# Patient Record
Sex: Female | Born: 1960
Health system: Southern US, Community
[De-identification: ages and names within clinical notes are randomized; demographics above are authoritative.]

## PROBLEM LIST (undated history)

## (undated) DIAGNOSIS — R21 Rash and other nonspecific skin eruption: Secondary | ICD-10-CM

## (undated) DIAGNOSIS — N62 Hypertrophy of breast: Secondary | ICD-10-CM

## (undated) DIAGNOSIS — M1990 Unspecified osteoarthritis, unspecified site: Secondary | ICD-10-CM

## (undated) DIAGNOSIS — E049 Nontoxic goiter, unspecified: Secondary | ICD-10-CM

## (undated) HISTORY — DX: Unspecified osteoarthritis, unspecified site: M19.90

## (undated) HISTORY — PX: BREAST SURGERY: SHX581

## (undated) HISTORY — PX: LABIAL ADHESION LYSIS: SHX324

## (undated) HISTORY — DX: Rash and other nonspecific skin eruption: R21

## (undated) HISTORY — DX: Nontoxic goiter, unspecified: E04.9

## (undated) HISTORY — PX: ABDOMINAL HYSTERECTOMY: SHX81

## (undated) HISTORY — PX: TUBAL LIGATION: SHX77

## (undated) SURGERY — BREAST REDUCTION WITH LIPOSUCTION
Anesthesia: General | Laterality: Bilateral

---

## 1999-09-22 ENCOUNTER — Encounter (INDEPENDENT_AMBULATORY_CARE_PROVIDER_SITE_OTHER): Payer: Self-pay

## 1999-09-22 ENCOUNTER — Other Ambulatory Visit: Admission: RE | Admit: 1999-09-22 | Discharge: 1999-09-22 | Payer: Self-pay | Admitting: Obstetrics and Gynecology

## 1999-11-25 ENCOUNTER — Emergency Department (HOSPITAL_COMMUNITY): Admission: EM | Admit: 1999-11-25 | Discharge: 1999-11-25 | Payer: Self-pay | Admitting: *Deleted

## 1999-11-26 ENCOUNTER — Encounter: Payer: Self-pay | Admitting: Emergency Medicine

## 2000-02-16 ENCOUNTER — Encounter (INDEPENDENT_AMBULATORY_CARE_PROVIDER_SITE_OTHER): Payer: Self-pay | Admitting: Specialist

## 2000-02-16 ENCOUNTER — Ambulatory Visit (HOSPITAL_COMMUNITY): Admission: RE | Admit: 2000-02-16 | Discharge: 2000-02-16 | Payer: Self-pay | Admitting: Obstetrics and Gynecology

## 2002-07-29 ENCOUNTER — Encounter: Payer: Self-pay | Admitting: Internal Medicine

## 2002-07-29 ENCOUNTER — Encounter: Admission: RE | Admit: 2002-07-29 | Discharge: 2002-07-29 | Payer: Self-pay | Admitting: Internal Medicine

## 2002-08-07 ENCOUNTER — Encounter: Payer: Self-pay | Admitting: Internal Medicine

## 2002-08-07 ENCOUNTER — Encounter: Admission: RE | Admit: 2002-08-07 | Discharge: 2002-08-07 | Payer: Self-pay | Admitting: Internal Medicine

## 2002-12-07 ENCOUNTER — Encounter: Payer: Self-pay | Admitting: Internal Medicine

## 2002-12-07 ENCOUNTER — Encounter: Admission: RE | Admit: 2002-12-07 | Discharge: 2002-12-07 | Payer: Self-pay | Admitting: Internal Medicine

## 2004-01-28 ENCOUNTER — Encounter: Admission: RE | Admit: 2004-01-28 | Discharge: 2004-01-28 | Payer: Self-pay | Admitting: Obstetrics and Gynecology

## 2004-04-07 ENCOUNTER — Encounter (INDEPENDENT_AMBULATORY_CARE_PROVIDER_SITE_OTHER): Payer: Self-pay | Admitting: Specialist

## 2004-04-07 ENCOUNTER — Ambulatory Visit (HOSPITAL_COMMUNITY): Admission: RE | Admit: 2004-04-07 | Discharge: 2004-04-07 | Payer: Self-pay | Admitting: Obstetrics and Gynecology

## 2005-02-22 ENCOUNTER — Encounter: Admission: RE | Admit: 2005-02-22 | Discharge: 2005-02-22 | Payer: Self-pay | Admitting: Obstetrics and Gynecology

## 2005-06-08 ENCOUNTER — Other Ambulatory Visit: Admission: RE | Admit: 2005-06-08 | Discharge: 2005-06-08 | Payer: Self-pay | Admitting: Obstetrics and Gynecology

## 2006-03-26 ENCOUNTER — Encounter: Admission: RE | Admit: 2006-03-26 | Discharge: 2006-03-26 | Payer: Self-pay | Admitting: Obstetrics and Gynecology

## 2006-09-13 ENCOUNTER — Other Ambulatory Visit: Admission: RE | Admit: 2006-09-13 | Discharge: 2006-09-13 | Payer: Self-pay | Admitting: Obstetrics and Gynecology

## 2006-11-22 ENCOUNTER — Encounter (INDEPENDENT_AMBULATORY_CARE_PROVIDER_SITE_OTHER): Payer: Self-pay | Admitting: Specialist

## 2006-11-22 ENCOUNTER — Ambulatory Visit (HOSPITAL_BASED_OUTPATIENT_CLINIC_OR_DEPARTMENT_OTHER): Admission: RE | Admit: 2006-11-22 | Discharge: 2006-11-22 | Payer: Self-pay | Admitting: Obstetrics and Gynecology

## 2007-04-03 ENCOUNTER — Encounter: Admission: RE | Admit: 2007-04-03 | Discharge: 2007-04-03 | Payer: Self-pay | Admitting: Obstetrics and Gynecology

## 2007-11-28 ENCOUNTER — Emergency Department (HOSPITAL_COMMUNITY): Admission: EM | Admit: 2007-11-28 | Discharge: 2007-11-28 | Payer: Self-pay | Admitting: Family Medicine

## 2008-01-24 ENCOUNTER — Emergency Department (HOSPITAL_COMMUNITY): Admission: EM | Admit: 2008-01-24 | Discharge: 2008-01-25 | Payer: Self-pay | Admitting: Emergency Medicine

## 2008-01-24 ENCOUNTER — Emergency Department (HOSPITAL_COMMUNITY): Admission: EM | Admit: 2008-01-24 | Discharge: 2008-01-24 | Payer: Self-pay | Admitting: Family Medicine

## 2008-03-04 ENCOUNTER — Emergency Department (HOSPITAL_COMMUNITY): Admission: EM | Admit: 2008-03-04 | Discharge: 2008-03-04 | Payer: Self-pay | Admitting: Emergency Medicine

## 2008-07-06 ENCOUNTER — Encounter: Admission: RE | Admit: 2008-07-06 | Discharge: 2008-07-06 | Payer: Self-pay | Admitting: Obstetrics and Gynecology

## 2009-09-06 ENCOUNTER — Encounter: Admission: RE | Admit: 2009-09-06 | Discharge: 2009-09-06 | Payer: Self-pay | Admitting: Obstetrics and Gynecology

## 2010-02-14 ENCOUNTER — Encounter: Admission: RE | Admit: 2010-02-14 | Discharge: 2010-02-14 | Payer: Self-pay | Admitting: Family Medicine

## 2010-09-11 ENCOUNTER — Encounter: Admission: RE | Admit: 2010-09-11 | Discharge: 2010-09-11 | Payer: Self-pay | Admitting: Obstetrics and Gynecology

## 2010-12-23 ENCOUNTER — Encounter: Payer: Self-pay | Admitting: Internal Medicine

## 2010-12-25 ENCOUNTER — Encounter: Payer: Self-pay | Admitting: Obstetrics and Gynecology

## 2011-02-08 ENCOUNTER — Other Ambulatory Visit: Payer: Self-pay | Admitting: Family Medicine

## 2011-02-08 DIAGNOSIS — E041 Nontoxic single thyroid nodule: Secondary | ICD-10-CM

## 2011-02-12 ENCOUNTER — Ambulatory Visit
Admission: RE | Admit: 2011-02-12 | Discharge: 2011-02-12 | Disposition: A | Discharge status: Home / Self Care | Payer: BC Managed Care – PPO | Source: Ambulatory Visit | Attending: Family Medicine | Admitting: Family Medicine

## 2011-02-12 DIAGNOSIS — E041 Nontoxic single thyroid nodule: Secondary | ICD-10-CM

## 2011-02-14 ENCOUNTER — Other Ambulatory Visit: Payer: Self-pay | Admitting: Family Medicine

## 2011-02-14 DIAGNOSIS — E041 Nontoxic single thyroid nodule: Secondary | ICD-10-CM

## 2011-02-27 ENCOUNTER — Other Ambulatory Visit (HOSPITAL_COMMUNITY)
Admission: RE | Admit: 2011-02-27 | Discharge: 2011-02-27 | Disposition: A | Discharge status: Home / Self Care | Payer: BC Managed Care – PPO | Source: Ambulatory Visit | Attending: Interventional Radiology | Admitting: Interventional Radiology

## 2011-02-27 ENCOUNTER — Ambulatory Visit
Admission: RE | Admit: 2011-02-27 | Discharge: 2011-02-27 | Disposition: A | Discharge status: Home / Self Care | Payer: BC Managed Care – PPO | Source: Ambulatory Visit | Attending: Family Medicine | Admitting: Family Medicine

## 2011-02-27 ENCOUNTER — Other Ambulatory Visit: Payer: Self-pay | Admitting: Interventional Radiology

## 2011-02-27 DIAGNOSIS — E041 Nontoxic single thyroid nodule: Secondary | ICD-10-CM | POA: Insufficient documentation

## 2011-04-20 NOTE — H&P (Signed)
NAME:  Lisa Bell, Lisa Bell                       ACCOUNT NO.:  [id number]   MEDICAL RECORD NO.:  [id number]                   PATIENT TYPE:  AMB   LOCATION:  SDC                                  FACILITY:  [facility]   PHYSICIAN:  Huel Cote, M.D.              DATE OF BIRTH:  1961/06/22   DATE OF ADMISSION:  DATE OF DISCHARGE:                                HISTORY & PHYSICAL   The patient is a 50 year old G2, P0, who is admitted for a scheduled  laparoscopic bilateral tubal ligation.  The patient desires permanent  sterility and has given other options of birth control, however feels that  she does not wish to try any other options but wishes complete and permanent  sterilization.   PAST MEDICAL HISTORY:  Negative.   PAST OBSTETRICAL HISTORY:  Significant for elective abortion x2.   PAST SURGICAL HISTORY:  History of LEEP in 2001.   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  Pap smears  after that have been essentially negative.   The patient has allergies to SULFA, TETRACYCLINE, and CODEINE.   Current medications include Ortho Evra patch.   PHYSICAL EXAMINATION:  VITAL SIGNS:  The patient's weight is 168 pounds,  blood pressure 108/60.  CARDIAC:  Cardiac exam is regular rate and rhythm.  CHEST:  Lungs are clear.  ABDOMEN:  Soft and nontender.  PELVIC:  She had normal external genitalia.  The uterus is normal size.  Adnexa have no masses noted.   As stated previously, the patient was counseled closely regarding all of her  contraception options, including Mirena, birth control pills, permanent  sterilization with Essure or tubal ligation.  The patient has considered all  her options and definitely desires to proceed with permanent sterilization.  She understands there is a risk of failure of approximately one in 100 after  tubal ligation.  She also understands the risks of damage to bowel or  bladder, possible bleeding and infection that may occur with  laparoscopic  surgery, and desires to proceed as stated.                                               Huel Cote, M.D.    KR/MEDQ  D:  04/06/2004  T:  04/06/2004  Job:  [id number]

## 2011-04-20 NOTE — Op Note (Signed)
NAME:  Lisa Bell, Lisa Bell                       ACCOUNT NO.:  [id number]   MEDICAL RECORD NO.:  [id number]                   PATIENT TYPE:  AMB   LOCATION:  SDC                                  FACILITY:  [facility]   PHYSICIAN:  Huel Cote, M.D.              DATE OF BIRTH:  15-Nov-1961   DATE OF PROCEDURE:  04/07/2004  DATE OF DISCHARGE:                                 OPERATIVE REPORT   PREOPERATIVE DIAGNOSIS:  Desires sterility.   POSTOPERATIVE DIAGNOSIS:  Desires sterility with two left paratubal cysts.   PROCEDURE:  Laparoscopic tubal fulguration and excision of left paratubal  cyst x 2.   SURGEON:  Huel Cote, M.D.   ANESTHESIA:  General.   FINDINGS:  Ovaries were normal bilaterally.  Uterus was normal.  Tubes had  mild distal clubbing noted bilaterally, and the left tube had two distal  paratubal cysts which were excised due to torsion risk.   FLUIDS:  Estimated blood loss minimal.  IV fluids 1200 cc LR.  Urine output  100 cc clear.   PROCEDURE:  The patient was taken to the operating room where general  anesthesia was obtained without difficulty.  She was then prepped and draped  in the normal sterile fashion in the dorsal lithotomy position.  A speculum  was placed within the patient's vagina and the cervic identified and grasped  with a single-tooth tenaculum.  The cervical os was noted to be stenotic  from her previous LEEP procedure and required some dilation before the Hulka  tenaculum could be introduced.  With the Hulka tenaculum on the cervix, the  speculum was removed and attention was turned to the patient's abdomen,  where a small infraumbilical incision was made with the scalpel.  The Veress  needle was then introduced into this incision and intraperitoneal placement  confirmed with both aspiration and injection with normal saline.  Gas flow  was then applied and pneumoperitoneum obtained with approximately 2 L of CO2  gas.  The Veress needle was then  removed and the trocar 10-11 in size placed  within the umbilicus.  The camera was then introduced and the pelvis  inspected with the findings as previously stated.  Each fallopian tube was  then grasped with the Kleppinger cautery unit in approximately two to three  sequences to have good blanching of approximately 2 cm segments of tube.  This was performed bilaterally, with good result.   Attention was then turned to the left fallopian tube where two distal  paratubal cysts were noted at the fimbriated end of the tube.  They did  appear to be somewhat twisted and were felt to have a significant torsion  risk; therefore, Kleppinger cautery was used to cauterize them at their base  and the scissors then removed them from the tube itself.  They were placed  in the anterior cul-de-sac, drained with a needle aspiration unit, and then  the cyst itself  wall removed through the 10-11 trocar site with the  graspers.  These were sent to pathology.   All areas were then inspected and found to be hemostatic.  Therefore the  camera was removed and the 10-11 trocar removed after pneumoperitoneum was  reduced.  The umbilical incision was then closed with one interrupted suture  of 0 Vicryl in a deep stitch and a subcuticular stitch of 3-0 Vicryl.  Sponge, lap, and needle counts were correct x 2.  The tenaculum was removed  from the patient's vagina and she was awakened and taken to the recovery  room in stable condition.                                               Huel Cote, M.D.    KR/MEDQ  D:  04/07/2004  T:  04/07/2004  Job:  [id number]

## 2011-04-20 NOTE — Op Note (Signed)
NAMEGAETANA, Lisa Bell             ACCOUNT NO.:  [id number]   MEDICAL RECORD NO.:  [id number]          PATIENT TYPE:  AMB   LOCATION:  [facility]                         FACILITY:  [facility]   PHYSICIAN:  Malachi Pro. Ambrose Mantle, M.D. DATE OF BIRTH:  10-24-1961   DATE OF PROCEDURE:  11/22/2006  DATE OF DISCHARGE:                               OPERATIVE REPORT   PREOPERATIVE DIAGNOSIS:  Vulvar cyst.   POSTOPERATIVE DIAGNOSES:  Vulvar cyst.   OPERATION:  Removal of vulvar cyst.   OPERATOR:  Dr. Tracey Harries.   ANESTHESIA:  General anesthesia.   The patient was brought to the operating room and placed under  satisfactory general anesthesia and placed in lithotomy position.  The  exam revealed what was about a 3 x 1-1/2 cm subcutaneous cyst in the  right vulva at approximately 7:30 to 8:00 from the vaginal introitus.  The cyst could be grasped and held up in the subcu tissue and it felt  like it was completely separate from any chance of communication with  the rectum.  The area was prepped with Betadine solution, draped as a  sterile field and with a 15 knife blade the cyst was removed.  I tried  to stay as close to the cyst as I could so that as little skin as  possible would be removed.  I then reapproximated the skin with  interrupted vertical mattress sutures of 3-0 Vicryl and simple  interrupted sutures of 3-0 Vicryl. At the end of the procedure, there  was no bleeding.  Blood loss for the procedure was less than 50 mL.  Sponge and needle counts were correct and she was returned to recovery  in satisfactory condition.      Malachi Pro. Ambrose Mantle, M.D.  Electronically Signed     TFH/MEDQ  D:  11/22/2006  T:  11/23/2006  Job:  [id number]

## 2011-07-15 ENCOUNTER — Inpatient Hospital Stay (INDEPENDENT_AMBULATORY_CARE_PROVIDER_SITE_OTHER)
Admission: RE | Admit: 2011-07-15 | Discharge: 2011-07-15 | Disposition: A | Discharge status: Home / Self Care | Payer: Self-pay | Source: Ambulatory Visit | Attending: Family Medicine | Admitting: Family Medicine

## 2011-07-15 DIAGNOSIS — M549 Dorsalgia, unspecified: Secondary | ICD-10-CM

## 2011-07-15 DIAGNOSIS — M799 Soft tissue disorder, unspecified: Secondary | ICD-10-CM

## 2011-08-08 HISTORY — PX: FINGER AMPUTATION: SHX636

## 2011-08-09 ENCOUNTER — Emergency Department (HOSPITAL_COMMUNITY): Payer: Worker's Compensation

## 2011-08-09 ENCOUNTER — Observation Stay (HOSPITAL_COMMUNITY)
Admission: EM | Admit: 2011-08-09 | Discharge: 2011-08-09 | Disposition: A | Discharge status: Home / Self Care | Payer: Worker's Compensation | Source: Ambulatory Visit | Attending: Orthopedic Surgery | Admitting: Orthopedic Surgery

## 2011-08-09 DIAGNOSIS — Y990 Civilian activity done for income or pay: Secondary | ICD-10-CM | POA: Insufficient documentation

## 2011-08-09 DIAGNOSIS — W319XXA Contact with unspecified machinery, initial encounter: Secondary | ICD-10-CM | POA: Insufficient documentation

## 2011-08-09 DIAGNOSIS — S6710XA Crushing injury of unspecified finger(s), initial encounter: Principal | ICD-10-CM | POA: Insufficient documentation

## 2011-08-09 DIAGNOSIS — S62639B Displaced fracture of distal phalanx of unspecified finger, initial encounter for open fracture: Secondary | ICD-10-CM | POA: Insufficient documentation

## 2011-08-09 DIAGNOSIS — IMO0002 Reserved for concepts with insufficient information to code with codable children: Secondary | ICD-10-CM | POA: Insufficient documentation

## 2011-08-09 DIAGNOSIS — Y9269 Other specified industrial and construction area as the place of occurrence of the external cause: Secondary | ICD-10-CM | POA: Insufficient documentation

## 2011-08-15 NOTE — Op Note (Signed)
Lisa Bell, Lisa Bell NO.:  [id number]  MEDICAL RECORD NO.:  [id number]  LOCATION:  2550                         FACILITY:  [facility]  PHYSICIAN:  Lisa Done, MD  DATE OF BIRTH:  02/06/1961  DATE OF PROCEDURE: DATE OF DISCHARGE:                              OPERATIVE REPORT   PREOPERATIVE DIAGNOSES: 1. Right index finger traumatic amputation through the level of the     distal phalanx. 2. Right index finger middle phalanx fracture involving the articular     surface comminuted fracture.  POSTOPERATIVE DIAGNOSES: 1. Right index finger traumatic amputation through the level of the     distal phalanx. 2. Right index finger middle phalanx fracture involving the articular     surface comminuted fracture.  ATTENDING PHYSICIAN:  Lisa Done, MD who scrubbed and was present for the entire procedure.  ASSISTANT SURGEON:  None.  ANESTHESIA:  General via LMA.  TOURNIQUET TIME:  Less than 50 minutes at 250 mmHg.  SURGICAL PROCEDURES: 1. Left index finger amputation with local neurectomies and a primary     wound closure. 2. Left index finger open treatment of the middle phalanx fracture     involving the articular surface without internal fixation.  INTRAOPERATIVE FINDINGS:  The patient did have significant crush injury to the index finger.  The soft tissues were not amenable to replantation of the distal tip.  The patient had no distal interphalangeal joint.  So if this finger would have been replanted, there would have been no joint to reconstruct.  The amputation was through the distal phalanx, but the articular surface of the middle phalanx was disrupted and the patient really had no articular cartilage of the distal phalanx.  SURGICAL INDICATIONS:  Lisa Bell is a right-hand-dominant female who sustained a crushing injury to her right index finger with the open distal phalanx injury.  The patient was seen and evaluated in the emergency  department and was recommended to undergo the above procedure. Risks, benefits, and alternatives were discussed in detail with the patient and a signed informed consent was obtained.  Risks include but not limited to bleeding; infection; damage to nearby nerves, arteries, or tendons; persistent pain into the thumb; loss of movement and need for further surgical intervention.  DESCRIPTION OF PROCEDURE:  The patient was properly identified in the preop holding area and a mark with permanent marker made on the right index finger indicating the correct operative site.  The patient was then brought back to the operating room,  placed supine on the anesthesia table where general anesthesia was administered.  The patient tolerated this well.  A well-padded tourniquet was then placed on the right brachium and sealed with 1000 drape.  The right upper extremity was prepped and draped in normal sterile fashion.  Time-out was called. Correct site was identified and procedure was then begun.  Attention was then turned to evaluation of the soft tissue to see if the cannula would a candidate for replantation.  The soft tissue were not amenable to replantation.  The patient had no distal interphalangeal joint with the extensive comminution as noted above.  Following this, completion, amputation was then carried out  through the level of the distal interphalangeal joint.  The FDP was then identified and then transected and allowed to retract proximally.  The digital nerves were identified and digital nerve neurectomies were then carried out both radially and ulnarly.  The patient did have a comminuted middle phalanx fracture involving the articular surface.  It was left and open treatment was then carried out.  I did not destabilize the comminuted fracture and no internal fixation was used.  The wound was then thoroughly irrigated. Copious wound irrigation was then Bell.  Final debridement of the skin and  subcutaneous tissues were then carried out.  Following this, the skin flaps were then closed primarily.  This was closed with simple 5-0 nylon sutures.  Following this, tourniquet was deflated.  There was good perfusion of the finger.  An 8 mL of 0.25% Marcaine infiltrated locally. Adaptic dressing, sterile compressive bandage was then applied.  The patient tolerated the procedure well and returned to the recovery room in good condition.  POSTOPERATIVE PLAN:  The patient will be discharged home, seen back in the office in approximately 7 days for wound check.  I sent down to our therapist for a small tip protector splint and begin working on her PIP movement.     Lisa Done, MD     FWO/MEDQ  D:  08/09/2011  T:  08/09/2011  Job:  [id number]  Electronically Signed by Lisa Bell IV MD on 08/15/2011 11:37:52 AM

## 2011-09-06 ENCOUNTER — Other Ambulatory Visit: Payer: Self-pay | Admitting: Obstetrics and Gynecology

## 2011-09-06 DIAGNOSIS — Z1231 Encounter for screening mammogram for malignant neoplasm of breast: Secondary | ICD-10-CM

## 2011-09-25 ENCOUNTER — Ambulatory Visit
Admission: RE | Admit: 2011-09-25 | Discharge: 2011-09-25 | Disposition: A | Discharge status: Home / Self Care | Payer: Worker's Compensation | Source: Ambulatory Visit | Attending: Obstetrics and Gynecology | Admitting: Obstetrics and Gynecology

## 2011-09-25 DIAGNOSIS — Z1231 Encounter for screening mammogram for malignant neoplasm of breast: Secondary | ICD-10-CM

## 2012-02-12 ENCOUNTER — Other Ambulatory Visit: Payer: Self-pay | Admitting: Family Medicine

## 2012-02-12 DIAGNOSIS — E041 Nontoxic single thyroid nodule: Secondary | ICD-10-CM

## 2012-02-13 ENCOUNTER — Other Ambulatory Visit: Payer: Self-pay

## 2012-02-14 ENCOUNTER — Other Ambulatory Visit: Payer: Self-pay

## 2012-02-15 ENCOUNTER — Other Ambulatory Visit: Payer: Self-pay

## 2012-02-25 ENCOUNTER — Ambulatory Visit
Admission: RE | Admit: 2012-02-25 | Discharge: 2012-02-25 | Disposition: A | Discharge status: Home / Self Care | Payer: BC Managed Care – PPO | Source: Ambulatory Visit | Attending: Family Medicine | Admitting: Family Medicine

## 2012-02-25 DIAGNOSIS — E041 Nontoxic single thyroid nodule: Secondary | ICD-10-CM

## 2012-03-03 ENCOUNTER — Encounter (INDEPENDENT_AMBULATORY_CARE_PROVIDER_SITE_OTHER): Payer: Self-pay | Admitting: Surgery

## 2012-03-17 ENCOUNTER — Ambulatory Visit (INDEPENDENT_AMBULATORY_CARE_PROVIDER_SITE_OTHER): Payer: BC Managed Care – PPO | Admitting: Surgery

## 2012-04-04 ENCOUNTER — Ambulatory Visit (INDEPENDENT_AMBULATORY_CARE_PROVIDER_SITE_OTHER): Payer: BC Managed Care – PPO | Admitting: Surgery

## 2012-04-30 ENCOUNTER — Ambulatory Visit (INDEPENDENT_AMBULATORY_CARE_PROVIDER_SITE_OTHER): Payer: BC Managed Care – PPO | Admitting: Surgery

## 2012-05-08 ENCOUNTER — Encounter (INDEPENDENT_AMBULATORY_CARE_PROVIDER_SITE_OTHER): Payer: Self-pay | Admitting: Surgery

## 2012-06-04 ENCOUNTER — Encounter (INDEPENDENT_AMBULATORY_CARE_PROVIDER_SITE_OTHER): Payer: Self-pay | Admitting: Surgery

## 2012-06-04 ENCOUNTER — Ambulatory Visit (INDEPENDENT_AMBULATORY_CARE_PROVIDER_SITE_OTHER): Payer: BC Managed Care – PPO | Admitting: Surgery

## 2012-06-04 VITALS — BP 112/84 | HR 75 | Temp 98.3°F | Ht 63.0 in | Wt 169.4 lb

## 2012-06-04 DIAGNOSIS — E042 Nontoxic multinodular goiter: Secondary | ICD-10-CM | POA: Insufficient documentation

## 2012-06-04 NOTE — Progress Notes (Signed)
Patient ID: Lisa Bell, female   DOB: 01-05-1961, 51 y.o.   MRN: [id number]  Chief Complaint  Patient presents with  . Pre-op Exam    eval multi nodular goiter    HPI Lisa Bell is a 51 y.o. female.  She is referred by Corky Mull PA for evaluation of a multinodular goiter. I am uncertain whether she actually has any symptoms. On questioning she does not have hyper or hypothyroidism. She has no difficulty swallowing. She reports she believes she does have swelling on the right side of her neck which is visible but is otherwise asymptomatic. She had a recent thyroid ultrasound demonstrating multinodular goiter and a biopsy consistent with goiter as well. Her mother had a partial thyroidectomy for a symptomatic goiter HPI  Past Medical History  Diagnosis Date  . Rash, skin   . Goiter   . Arthritis     Past Surgical History  Procedure Date  . Tubal ligation   . Breast surgery     cyst (benign)  . Labial adhesion lysis   . Finger amputation 08/08/11    right    Family History  Problem Relation Age of Onset  . Cancer Mother     ovarian  . Kidney disease Father     Social History History  Substance Use Topics  . Smoking status: Never Smoker   . Smokeless tobacco: Not on file  . Alcohol Use: No    Allergies  Allergen Reactions  . Codeine Other (See Comments)    GI UPSET  . Septra (Bactrim) Other (See Comments)    GI UPSET  . Tetracyclines & Related Other (See Comments)    GI UPSET    Current Outpatient Prescriptions  Medication Sig Dispense Refill  . Ascorbic Acid (VITAMIN C) 100 MG tablet Take 100 mg by mouth daily.      . calcium & magnesium carbonates (MYLANTA) 311-232 MG per tablet Take 1 tablet by mouth 2 (two) times daily.      . cholecalciferol (VITAMIN D) 1000 UNITS tablet Take 3,000 Units by mouth daily.      Marland Kitchen desonide (DESOWEN) 0.05 % cream Apply topically 2 (two) times daily.        Review of Systems Review of Systems  Constitutional:  Negative for fever, chills and unexpected weight change.  HENT: Negative for hearing loss, congestion, sore throat, trouble swallowing and voice change.   Eyes: Negative for visual disturbance.  Respiratory: Negative for cough and wheezing.   Cardiovascular: Negative for chest pain, palpitations and leg swelling.  Gastrointestinal: Negative for nausea, vomiting, abdominal pain, diarrhea, constipation, blood in stool, abdominal distention and anal bleeding.  Genitourinary: Negative for hematuria, vaginal bleeding and difficulty urinating.  Musculoskeletal: Negative for arthralgias.  Skin: Negative for rash and wound.  Neurological: Negative for seizures, syncope and headaches.  Hematological: Negative for adenopathy. Does not bruise/bleed easily.  Psychiatric/Behavioral: Negative for confusion.    Blood pressure 112/84, pulse 75, temperature 98.3 F (36.8 C), temperature source Temporal, height 5\' 3"  (1.6 m), weight 169 lb 6.4 oz (76.839 kg), SpO2 94.00%.  Physical Exam Physical Exam  Constitutional: She appears well-developed and well-nourished. No distress.  HENT:  Head: Normocephalic and atraumatic.  Right Ear: External ear normal.  Left Ear: External ear normal.  Nose: Nose normal.  Mouth/Throat: Oropharynx is clear and moist.  Eyes: Conjunctivae are normal. Pupils are equal, round, and reactive to light. Right eye exhibits no discharge. Left eye exhibits no discharge. No scleral icterus.  Neck:  Normal range of motion. Neck supple. No JVD present. No tracheal deviation present. Thyromegaly present.  Cardiovascular: Normal rate, regular rhythm, normal heart sounds and intact distal pulses.   Pulmonary/Chest: Effort normal and breath sounds normal. No respiratory distress. She has no wheezes. She has no rales.  Lymphadenopathy:    She has no cervical adenopathy.  Skin: Skin is warm and dry. No rash noted. She is not diaphoretic. No erythema.  Psychiatric: Her behavior is normal.     Data Reviewed I have reviewed her ultrasound showing multinodular goiter with a slightly large dominant right thyroid nodule. Biopsy is consistent with a goiter.  Assessment    Multinodular goiter    Plan    We discussed thyroidectomy. I gave her literature regarding this. I discussed all the risks of undergoing a total thyroidectomy. Since she is currently asymptomatic, and there is no evidence of malignancy, she elected to hold on thyroidectomy which I feel is very reasonable. She would like to be referred to an endocrinologist for another opinion as well. I will see her back in 6 months to discuss this further. I will see her back sooner should she change her mind       Simranjit Thayer A 06/04/2012, 4:23 PM

## 2012-08-21 ENCOUNTER — Other Ambulatory Visit: Payer: Self-pay | Admitting: Obstetrics and Gynecology

## 2012-08-21 DIAGNOSIS — Z1231 Encounter for screening mammogram for malignant neoplasm of breast: Secondary | ICD-10-CM

## 2012-09-25 ENCOUNTER — Ambulatory Visit
Admission: RE | Admit: 2012-09-25 | Discharge: 2012-09-25 | Disposition: A | Discharge status: Home / Self Care | Payer: BC Managed Care – PPO | Source: Ambulatory Visit | Attending: Obstetrics and Gynecology | Admitting: Obstetrics and Gynecology

## 2012-09-25 ENCOUNTER — Ambulatory Visit: Payer: Self-pay

## 2012-09-25 DIAGNOSIS — Z1231 Encounter for screening mammogram for malignant neoplasm of breast: Secondary | ICD-10-CM

## 2012-09-26 ENCOUNTER — Other Ambulatory Visit: Payer: Self-pay | Admitting: Physician Assistant

## 2012-09-26 ENCOUNTER — Ambulatory Visit
Admission: RE | Admit: 2012-09-26 | Discharge: 2012-09-26 | Disposition: A | Discharge status: Home / Self Care | Payer: BC Managed Care – PPO | Source: Ambulatory Visit | Attending: Physician Assistant | Admitting: Physician Assistant

## 2012-09-26 DIAGNOSIS — T1490XA Injury, unspecified, initial encounter: Secondary | ICD-10-CM

## 2013-08-21 ENCOUNTER — Other Ambulatory Visit: Payer: Self-pay

## 2013-08-21 DIAGNOSIS — Z1231 Encounter for screening mammogram for malignant neoplasm of breast: Secondary | ICD-10-CM

## 2013-09-28 ENCOUNTER — Ambulatory Visit
Admission: RE | Admit: 2013-09-28 | Discharge: 2013-09-28 | Disposition: A | Discharge status: Home / Self Care | Payer: BC Managed Care – PPO | Source: Ambulatory Visit

## 2013-09-28 DIAGNOSIS — Z1231 Encounter for screening mammogram for malignant neoplasm of breast: Secondary | ICD-10-CM

## 2014-08-04 ENCOUNTER — Other Ambulatory Visit: Payer: Self-pay | Admitting: Physician Assistant

## 2014-08-04 DIAGNOSIS — E049 Nontoxic goiter, unspecified: Secondary | ICD-10-CM

## 2014-08-04 DIAGNOSIS — M542 Cervicalgia: Secondary | ICD-10-CM

## 2014-08-05 ENCOUNTER — Other Ambulatory Visit: Payer: BC Managed Care – PPO

## 2014-08-10 ENCOUNTER — Other Ambulatory Visit: Payer: BC Managed Care – PPO

## 2014-08-12 ENCOUNTER — Ambulatory Visit
Admission: RE | Admit: 2014-08-12 | Discharge: 2014-08-12 | Disposition: A | Discharge status: Home / Self Care | Payer: BC Managed Care – PPO | Source: Ambulatory Visit | Attending: Physician Assistant | Admitting: Physician Assistant

## 2014-08-12 DIAGNOSIS — E049 Nontoxic goiter, unspecified: Secondary | ICD-10-CM

## 2014-08-12 DIAGNOSIS — M542 Cervicalgia: Secondary | ICD-10-CM

## 2014-08-17 ENCOUNTER — Other Ambulatory Visit: Payer: Self-pay | Admitting: Physician Assistant

## 2014-08-17 DIAGNOSIS — E041 Nontoxic single thyroid nodule: Secondary | ICD-10-CM

## 2014-08-24 ENCOUNTER — Ambulatory Visit
Admission: RE | Admit: 2014-08-24 | Discharge: 2014-08-24 | Disposition: A | Discharge status: Home / Self Care | Payer: BC Managed Care – PPO | Source: Ambulatory Visit | Attending: Physician Assistant | Admitting: Physician Assistant

## 2014-08-24 ENCOUNTER — Other Ambulatory Visit (HOSPITAL_COMMUNITY)
Admission: RE | Admit: 2014-08-24 | Discharge: 2014-08-24 | Disposition: A | Discharge status: Home / Self Care | Payer: BC Managed Care – PPO | Source: Ambulatory Visit | Attending: Interventional Radiology | Admitting: Interventional Radiology

## 2014-08-24 DIAGNOSIS — E041 Nontoxic single thyroid nodule: Secondary | ICD-10-CM | POA: Diagnosis present

## 2014-09-09 ENCOUNTER — Other Ambulatory Visit: Payer: Self-pay

## 2014-09-09 DIAGNOSIS — Z1239 Encounter for other screening for malignant neoplasm of breast: Secondary | ICD-10-CM

## 2014-09-23 ENCOUNTER — Other Ambulatory Visit: Payer: Self-pay

## 2014-09-23 DIAGNOSIS — Z1231 Encounter for screening mammogram for malignant neoplasm of breast: Secondary | ICD-10-CM

## 2014-09-29 ENCOUNTER — Ambulatory Visit
Admission: RE | Admit: 2014-09-29 | Discharge: 2014-09-29 | Disposition: A | Discharge status: Home / Self Care | Payer: BC Managed Care – PPO | Source: Ambulatory Visit

## 2014-09-29 DIAGNOSIS — Z1231 Encounter for screening mammogram for malignant neoplasm of breast: Secondary | ICD-10-CM

## 2015-01-10 ENCOUNTER — Other Ambulatory Visit: Payer: Self-pay | Admitting: Physician Assistant

## 2015-01-10 DIAGNOSIS — R102 Pelvic and perineal pain: Secondary | ICD-10-CM

## 2015-01-13 ENCOUNTER — Ambulatory Visit
Admission: RE | Admit: 2015-01-13 | Discharge: 2015-01-13 | Disposition: A | Discharge status: Home / Self Care | Payer: BLUE CROSS/BLUE SHIELD | Source: Ambulatory Visit | Attending: Physician Assistant | Admitting: Physician Assistant

## 2015-01-13 ENCOUNTER — Ambulatory Visit
Admission: RE | Admit: 2015-01-13 | Discharge: 2015-01-13 | Disposition: A | Discharge status: Home / Self Care | Payer: Self-pay | Source: Ambulatory Visit | Attending: Physician Assistant | Admitting: Physician Assistant

## 2015-01-13 DIAGNOSIS — R102 Pelvic and perineal pain: Secondary | ICD-10-CM

## 2015-01-20 DIAGNOSIS — M8580 Other specified disorders of bone density and structure, unspecified site: Secondary | ICD-10-CM | POA: Insufficient documentation

## 2015-01-20 DIAGNOSIS — K219 Gastro-esophageal reflux disease without esophagitis: Secondary | ICD-10-CM | POA: Insufficient documentation

## 2015-01-20 DIAGNOSIS — E041 Nontoxic single thyroid nodule: Secondary | ICD-10-CM | POA: Insufficient documentation

## 2015-01-20 DIAGNOSIS — R102 Pelvic and perineal pain: Secondary | ICD-10-CM | POA: Insufficient documentation

## 2015-01-20 DIAGNOSIS — R7302 Impaired glucose tolerance (oral): Secondary | ICD-10-CM | POA: Insufficient documentation

## 2015-05-03 ENCOUNTER — Other Ambulatory Visit: Payer: Self-pay | Admitting: Physician Assistant

## 2015-05-03 DIAGNOSIS — E041 Nontoxic single thyroid nodule: Secondary | ICD-10-CM

## 2015-05-05 ENCOUNTER — Other Ambulatory Visit: Payer: BLUE CROSS/BLUE SHIELD

## 2015-05-10 ENCOUNTER — Ambulatory Visit
Admission: RE | Admit: 2015-05-10 | Discharge: 2015-05-10 | Disposition: A | Discharge status: Home / Self Care | Payer: BLUE CROSS/BLUE SHIELD | Source: Ambulatory Visit | Attending: Physician Assistant | Admitting: Physician Assistant

## 2015-05-10 DIAGNOSIS — E041 Nontoxic single thyroid nodule: Secondary | ICD-10-CM

## 2015-08-24 ENCOUNTER — Other Ambulatory Visit: Payer: Self-pay

## 2015-08-24 DIAGNOSIS — Z1231 Encounter for screening mammogram for malignant neoplasm of breast: Secondary | ICD-10-CM

## 2015-08-24 DIAGNOSIS — Z803 Family history of malignant neoplasm of breast: Secondary | ICD-10-CM

## 2015-10-03 ENCOUNTER — Ambulatory Visit
Admission: RE | Admit: 2015-10-03 | Discharge: 2015-10-03 | Disposition: A | Discharge status: Home / Self Care | Payer: BLUE CROSS/BLUE SHIELD | Source: Ambulatory Visit

## 2015-10-03 DIAGNOSIS — Z1231 Encounter for screening mammogram for malignant neoplasm of breast: Secondary | ICD-10-CM

## 2015-10-03 DIAGNOSIS — Z803 Family history of malignant neoplasm of breast: Secondary | ICD-10-CM

## 2016-03-06 DIAGNOSIS — Z01419 Encounter for gynecological examination (general) (routine) without abnormal findings: Secondary | ICD-10-CM | POA: Diagnosis not present

## 2016-03-06 DIAGNOSIS — Z6834 Body mass index (BMI) 34.0-34.9, adult: Secondary | ICD-10-CM | POA: Diagnosis not present

## 2016-03-09 DIAGNOSIS — M7522 Bicipital tendinitis, left shoulder: Secondary | ICD-10-CM | POA: Diagnosis not present

## 2016-03-09 DIAGNOSIS — S161XXA Strain of muscle, fascia and tendon at neck level, initial encounter: Secondary | ICD-10-CM | POA: Diagnosis not present

## 2016-07-27 DIAGNOSIS — Z780 Asymptomatic menopausal state: Secondary | ICD-10-CM | POA: Diagnosis not present

## 2016-08-14 DIAGNOSIS — M25551 Pain in right hip: Secondary | ICD-10-CM | POA: Diagnosis not present

## 2016-08-14 DIAGNOSIS — G4719 Other hypersomnia: Secondary | ICD-10-CM | POA: Diagnosis not present

## 2016-08-27 DIAGNOSIS — N62 Hypertrophy of breast: Secondary | ICD-10-CM | POA: Diagnosis not present

## 2016-08-29 DIAGNOSIS — G4719 Other hypersomnia: Secondary | ICD-10-CM | POA: Diagnosis not present

## 2016-08-31 DIAGNOSIS — N62 Hypertrophy of breast: Secondary | ICD-10-CM | POA: Diagnosis not present

## 2016-08-31 DIAGNOSIS — R21 Rash and other nonspecific skin eruption: Secondary | ICD-10-CM | POA: Diagnosis not present

## 2016-08-31 DIAGNOSIS — M25519 Pain in unspecified shoulder: Secondary | ICD-10-CM | POA: Diagnosis not present

## 2016-09-04 ENCOUNTER — Other Ambulatory Visit: Payer: Self-pay | Admitting: Physician Assistant

## 2016-09-04 DIAGNOSIS — Z1231 Encounter for screening mammogram for malignant neoplasm of breast: Secondary | ICD-10-CM

## 2016-09-19 DIAGNOSIS — M546 Pain in thoracic spine: Secondary | ICD-10-CM | POA: Diagnosis not present

## 2016-09-19 DIAGNOSIS — N62 Hypertrophy of breast: Secondary | ICD-10-CM | POA: Diagnosis not present

## 2016-09-19 DIAGNOSIS — M542 Cervicalgia: Secondary | ICD-10-CM | POA: Diagnosis not present

## 2016-10-03 ENCOUNTER — Ambulatory Visit: Payer: BLUE CROSS/BLUE SHIELD

## 2016-10-31 ENCOUNTER — Ambulatory Visit
Admission: RE | Admit: 2016-10-31 | Discharge: 2016-10-31 | Disposition: A | Discharge status: Home / Self Care | Payer: BLUE CROSS/BLUE SHIELD | Source: Ambulatory Visit | Attending: Physician Assistant | Admitting: Physician Assistant

## 2016-10-31 DIAGNOSIS — Z1231 Encounter for screening mammogram for malignant neoplasm of breast: Secondary | ICD-10-CM

## 2016-12-03 HISTORY — PX: REDUCTION MAMMAPLASTY: SUR839

## 2016-12-18 DIAGNOSIS — M9901 Segmental and somatic dysfunction of cervical region: Secondary | ICD-10-CM | POA: Diagnosis not present

## 2016-12-18 DIAGNOSIS — M9902 Segmental and somatic dysfunction of thoracic region: Secondary | ICD-10-CM | POA: Diagnosis not present

## 2016-12-18 DIAGNOSIS — M9903 Segmental and somatic dysfunction of lumbar region: Secondary | ICD-10-CM | POA: Diagnosis not present

## 2016-12-18 DIAGNOSIS — M5386 Other specified dorsopathies, lumbar region: Secondary | ICD-10-CM | POA: Diagnosis not present

## 2016-12-24 DIAGNOSIS — M5386 Other specified dorsopathies, lumbar region: Secondary | ICD-10-CM | POA: Diagnosis not present

## 2016-12-24 DIAGNOSIS — M9901 Segmental and somatic dysfunction of cervical region: Secondary | ICD-10-CM | POA: Diagnosis not present

## 2016-12-24 DIAGNOSIS — M9902 Segmental and somatic dysfunction of thoracic region: Secondary | ICD-10-CM | POA: Diagnosis not present

## 2016-12-24 DIAGNOSIS — M9903 Segmental and somatic dysfunction of lumbar region: Secondary | ICD-10-CM | POA: Diagnosis not present

## 2016-12-25 DIAGNOSIS — M9901 Segmental and somatic dysfunction of cervical region: Secondary | ICD-10-CM | POA: Diagnosis not present

## 2016-12-25 DIAGNOSIS — M9902 Segmental and somatic dysfunction of thoracic region: Secondary | ICD-10-CM | POA: Diagnosis not present

## 2016-12-25 DIAGNOSIS — M5386 Other specified dorsopathies, lumbar region: Secondary | ICD-10-CM | POA: Diagnosis not present

## 2016-12-25 DIAGNOSIS — M9903 Segmental and somatic dysfunction of lumbar region: Secondary | ICD-10-CM | POA: Diagnosis not present

## 2016-12-27 DIAGNOSIS — M9903 Segmental and somatic dysfunction of lumbar region: Secondary | ICD-10-CM | POA: Diagnosis not present

## 2016-12-27 DIAGNOSIS — M9902 Segmental and somatic dysfunction of thoracic region: Secondary | ICD-10-CM | POA: Diagnosis not present

## 2016-12-27 DIAGNOSIS — M5386 Other specified dorsopathies, lumbar region: Secondary | ICD-10-CM | POA: Diagnosis not present

## 2016-12-27 DIAGNOSIS — M9901 Segmental and somatic dysfunction of cervical region: Secondary | ICD-10-CM | POA: Diagnosis not present

## 2017-01-02 DIAGNOSIS — M9902 Segmental and somatic dysfunction of thoracic region: Secondary | ICD-10-CM | POA: Diagnosis not present

## 2017-01-02 DIAGNOSIS — M5386 Other specified dorsopathies, lumbar region: Secondary | ICD-10-CM | POA: Diagnosis not present

## 2017-01-02 DIAGNOSIS — M9901 Segmental and somatic dysfunction of cervical region: Secondary | ICD-10-CM | POA: Diagnosis not present

## 2017-01-02 DIAGNOSIS — M9903 Segmental and somatic dysfunction of lumbar region: Secondary | ICD-10-CM | POA: Diagnosis not present

## 2017-01-03 DIAGNOSIS — M5386 Other specified dorsopathies, lumbar region: Secondary | ICD-10-CM | POA: Diagnosis not present

## 2017-01-03 DIAGNOSIS — M9901 Segmental and somatic dysfunction of cervical region: Secondary | ICD-10-CM | POA: Diagnosis not present

## 2017-01-03 DIAGNOSIS — M9902 Segmental and somatic dysfunction of thoracic region: Secondary | ICD-10-CM | POA: Diagnosis not present

## 2017-01-03 DIAGNOSIS — M9903 Segmental and somatic dysfunction of lumbar region: Secondary | ICD-10-CM | POA: Diagnosis not present

## 2017-01-08 DIAGNOSIS — M9903 Segmental and somatic dysfunction of lumbar region: Secondary | ICD-10-CM | POA: Diagnosis not present

## 2017-01-08 DIAGNOSIS — M9901 Segmental and somatic dysfunction of cervical region: Secondary | ICD-10-CM | POA: Diagnosis not present

## 2017-01-08 DIAGNOSIS — M9902 Segmental and somatic dysfunction of thoracic region: Secondary | ICD-10-CM | POA: Diagnosis not present

## 2017-01-08 DIAGNOSIS — M5386 Other specified dorsopathies, lumbar region: Secondary | ICD-10-CM | POA: Diagnosis not present

## 2017-01-09 DIAGNOSIS — M9901 Segmental and somatic dysfunction of cervical region: Secondary | ICD-10-CM | POA: Diagnosis not present

## 2017-01-09 DIAGNOSIS — M9903 Segmental and somatic dysfunction of lumbar region: Secondary | ICD-10-CM | POA: Diagnosis not present

## 2017-01-09 DIAGNOSIS — M5386 Other specified dorsopathies, lumbar region: Secondary | ICD-10-CM | POA: Diagnosis not present

## 2017-01-09 DIAGNOSIS — M9902 Segmental and somatic dysfunction of thoracic region: Secondary | ICD-10-CM | POA: Diagnosis not present

## 2017-01-10 DIAGNOSIS — E041 Nontoxic single thyroid nodule: Secondary | ICD-10-CM | POA: Diagnosis not present

## 2017-01-10 DIAGNOSIS — R7302 Impaired glucose tolerance (oral): Secondary | ICD-10-CM | POA: Diagnosis not present

## 2017-01-10 DIAGNOSIS — Z0000 Encounter for general adult medical examination without abnormal findings: Secondary | ICD-10-CM | POA: Diagnosis not present

## 2017-01-10 DIAGNOSIS — K219 Gastro-esophageal reflux disease without esophagitis: Secondary | ICD-10-CM | POA: Diagnosis not present

## 2017-01-15 DIAGNOSIS — M5386 Other specified dorsopathies, lumbar region: Secondary | ICD-10-CM | POA: Diagnosis not present

## 2017-01-15 DIAGNOSIS — M9901 Segmental and somatic dysfunction of cervical region: Secondary | ICD-10-CM | POA: Diagnosis not present

## 2017-01-15 DIAGNOSIS — M9903 Segmental and somatic dysfunction of lumbar region: Secondary | ICD-10-CM | POA: Diagnosis not present

## 2017-01-15 DIAGNOSIS — M9902 Segmental and somatic dysfunction of thoracic region: Secondary | ICD-10-CM | POA: Diagnosis not present

## 2017-01-17 DIAGNOSIS — M5386 Other specified dorsopathies, lumbar region: Secondary | ICD-10-CM | POA: Diagnosis not present

## 2017-01-17 DIAGNOSIS — M9903 Segmental and somatic dysfunction of lumbar region: Secondary | ICD-10-CM | POA: Diagnosis not present

## 2017-01-17 DIAGNOSIS — M9902 Segmental and somatic dysfunction of thoracic region: Secondary | ICD-10-CM | POA: Diagnosis not present

## 2017-01-17 DIAGNOSIS — M9901 Segmental and somatic dysfunction of cervical region: Secondary | ICD-10-CM | POA: Diagnosis not present

## 2017-01-24 DIAGNOSIS — M9903 Segmental and somatic dysfunction of lumbar region: Secondary | ICD-10-CM | POA: Diagnosis not present

## 2017-01-24 DIAGNOSIS — M9902 Segmental and somatic dysfunction of thoracic region: Secondary | ICD-10-CM | POA: Diagnosis not present

## 2017-01-24 DIAGNOSIS — M9901 Segmental and somatic dysfunction of cervical region: Secondary | ICD-10-CM | POA: Diagnosis not present

## 2017-01-24 DIAGNOSIS — M5386 Other specified dorsopathies, lumbar region: Secondary | ICD-10-CM | POA: Diagnosis not present

## 2017-01-31 DIAGNOSIS — M9902 Segmental and somatic dysfunction of thoracic region: Secondary | ICD-10-CM | POA: Diagnosis not present

## 2017-01-31 DIAGNOSIS — M9903 Segmental and somatic dysfunction of lumbar region: Secondary | ICD-10-CM | POA: Diagnosis not present

## 2017-01-31 DIAGNOSIS — M9901 Segmental and somatic dysfunction of cervical region: Secondary | ICD-10-CM | POA: Diagnosis not present

## 2017-01-31 DIAGNOSIS — M5386 Other specified dorsopathies, lumbar region: Secondary | ICD-10-CM | POA: Diagnosis not present

## 2017-02-05 DIAGNOSIS — M9902 Segmental and somatic dysfunction of thoracic region: Secondary | ICD-10-CM | POA: Diagnosis not present

## 2017-02-05 DIAGNOSIS — M9903 Segmental and somatic dysfunction of lumbar region: Secondary | ICD-10-CM | POA: Diagnosis not present

## 2017-02-05 DIAGNOSIS — M5386 Other specified dorsopathies, lumbar region: Secondary | ICD-10-CM | POA: Diagnosis not present

## 2017-02-05 DIAGNOSIS — M9901 Segmental and somatic dysfunction of cervical region: Secondary | ICD-10-CM | POA: Diagnosis not present

## 2017-02-12 DIAGNOSIS — M9901 Segmental and somatic dysfunction of cervical region: Secondary | ICD-10-CM | POA: Diagnosis not present

## 2017-02-12 DIAGNOSIS — M9902 Segmental and somatic dysfunction of thoracic region: Secondary | ICD-10-CM | POA: Diagnosis not present

## 2017-02-12 DIAGNOSIS — M9903 Segmental and somatic dysfunction of lumbar region: Secondary | ICD-10-CM | POA: Diagnosis not present

## 2017-02-12 DIAGNOSIS — M5386 Other specified dorsopathies, lumbar region: Secondary | ICD-10-CM | POA: Diagnosis not present

## 2017-06-10 DIAGNOSIS — Z01419 Encounter for gynecological examination (general) (routine) without abnormal findings: Secondary | ICD-10-CM | POA: Diagnosis not present

## 2017-06-10 DIAGNOSIS — Z6832 Body mass index (BMI) 32.0-32.9, adult: Secondary | ICD-10-CM | POA: Diagnosis not present

## 2017-06-24 ENCOUNTER — Other Ambulatory Visit: Payer: Self-pay | Admitting: Physician Assistant

## 2017-06-24 DIAGNOSIS — E041 Nontoxic single thyroid nodule: Secondary | ICD-10-CM | POA: Diagnosis not present

## 2017-07-02 ENCOUNTER — Ambulatory Visit
Admission: RE | Admit: 2017-07-02 | Discharge: 2017-07-02 | Disposition: A | Discharge status: Home / Self Care | Payer: BLUE CROSS/BLUE SHIELD | Source: Ambulatory Visit | Attending: Physician Assistant | Admitting: Physician Assistant

## 2017-07-02 DIAGNOSIS — E041 Nontoxic single thyroid nodule: Secondary | ICD-10-CM | POA: Diagnosis not present

## 2017-07-09 ENCOUNTER — Other Ambulatory Visit: Payer: Self-pay | Admitting: Physician Assistant

## 2017-07-09 DIAGNOSIS — R5381 Other malaise: Secondary | ICD-10-CM

## 2017-07-10 ENCOUNTER — Other Ambulatory Visit: Payer: Self-pay | Admitting: Physician Assistant

## 2017-07-10 DIAGNOSIS — E041 Nontoxic single thyroid nodule: Secondary | ICD-10-CM

## 2017-07-11 ENCOUNTER — Ambulatory Visit
Admission: RE | Admit: 2017-07-11 | Discharge: 2017-07-11 | Disposition: A | Discharge status: Home / Self Care | Payer: BLUE CROSS/BLUE SHIELD | Source: Ambulatory Visit | Attending: Physician Assistant | Admitting: Physician Assistant

## 2017-07-11 ENCOUNTER — Other Ambulatory Visit (HOSPITAL_COMMUNITY)
Admission: RE | Admit: 2017-07-11 | Discharge: 2017-07-11 | Disposition: A | Discharge status: Home / Self Care | Payer: BLUE CROSS/BLUE SHIELD | Source: Ambulatory Visit | Attending: Radiology | Admitting: Radiology

## 2017-07-11 DIAGNOSIS — E041 Nontoxic single thyroid nodule: Secondary | ICD-10-CM | POA: Insufficient documentation

## 2017-07-11 DIAGNOSIS — D34 Benign neoplasm of thyroid gland: Secondary | ICD-10-CM | POA: Diagnosis not present

## 2017-07-22 DIAGNOSIS — N62 Hypertrophy of breast: Secondary | ICD-10-CM | POA: Diagnosis not present

## 2017-07-22 DIAGNOSIS — M546 Pain in thoracic spine: Secondary | ICD-10-CM | POA: Diagnosis not present

## 2017-07-22 DIAGNOSIS — M542 Cervicalgia: Secondary | ICD-10-CM | POA: Diagnosis not present

## 2017-08-07 ENCOUNTER — Encounter (HOSPITAL_BASED_OUTPATIENT_CLINIC_OR_DEPARTMENT_OTHER): Payer: Self-pay | Admitting: *Deleted

## 2017-08-07 DIAGNOSIS — N62 Hypertrophy of breast: Secondary | ICD-10-CM

## 2017-08-07 HISTORY — DX: Hypertrophy of breast: N62

## 2017-08-09 NOTE — H&P (Signed)
Lisa Bell is an 56 y.o. female.   Chief Complaint: INcreased back and neck pain HPI: Pain with severity with intertrigo and shoulder pitting  Past Medical History:  Diagnosis Date  . Arthritis   . Goiter   . Macromastia 08/07/2017  . Rash, skin     Past Surgical History:  Procedure Laterality Date  . ABDOMINAL HYSTERECTOMY    . BREAST SURGERY     cyst (benign)  . FINGER AMPUTATION  08/08/11   right  . LABIAL ADHESION LYSIS    . TUBAL LIGATION      Family History  Problem Relation Age of Onset  . Cancer Mother        ovarian  . Kidney disease Father    Social History:  reports that she has never smoked. She has never used smokeless tobacco. She reports that she does not drink alcohol or use drugs.  Allergies:  Allergies  Allergen Reactions  . Codeine Other (See Comments)    GI UPSET  . Septra [Bactrim] Other (See Comments)    GI UPSET  . Tetracyclines & Related Other (See Comments)    GI UPSET    No prescriptions prior to admission.    No results found for this or any previous visit (from the past 48 hour(s)). No results found.  Review of Systems  Constitutional: Negative.   HENT: Negative.   Eyes: Negative.   Respiratory: Negative.   Cardiovascular: Negative.   Gastrointestinal: Negative.   Genitourinary: Negative.   Musculoskeletal: Positive for back pain and neck pain.  Skin: Positive for rash.  Neurological: Negative.   Endo/Heme/Allergies: Negative.   Psychiatric/Behavioral: Negative.     Height 5\' 3"  (1.6 m), weight 78.5 kg (173 lb). Physical Exam   Assessment/Plan Severe macromastia for bilatereal breast reductions and removal  Jrue Yambao L, MD 08/09/2017, 5:43 PM

## 2017-08-09 NOTE — H&P (Signed)
H&P documentation: done  -History and Physical Reviewed  -Patient has been re-examined  -No change in the plan of care  Lisa Bell L H&P documentation: done  -History and Physical Reviewed  -Patient has been re-examined  -No change in the plan of care  Lisa Bell L  Patient is a 56 y.o. female presents with macromastia. Onset of symptoms was gradual starting several months ago with gradually worsening course since that time. The pain is located both breasts. Patient describes the pain as severe continuous and rated as severe. Pain has been associated with rashes. . Symptoms are aggravated by lifting. Symptoms improve withno lifting. Past history includes rashes and shoulder pitting.  Previous studies include mammagraphy.  There are no active problems to display for this patient.  Past Medical History  Diagnosis Date  . Sleep apnea     no CPAP use  . Hypertension     under control; has been on med. x 10 yrs.  . Rheumatoid arthritis   . Macromastia 11/2011    Past Surgical History  Procedure Date  . Abdominal hysterectomy     complete  . Carpal tunnel release     bilat.  . Toe surgery     bilat. 5th toes    Prescriptions prior to admission  Medication Sig Dispense Refill  . calcium carbonate (OS-CAL) 600 MG TABS Take 600 mg by mouth 2 (two) times daily with a meal.        . cholecalciferol (VITAMIN D) 1000 UNITS tablet Take 1,000 Units by mouth daily.        . folic acid (FOLVITE) 1 MG tablet Take 1 mg by mouth daily. 5 tabs. daily AM       . hydroxychloroquine (PLAQUENIL) 200 MG tablet Take by mouth 2 (two) times daily.        Marland Kitchen losartan-hydrochlorothiazide (HYZAAR) 100-25 MG per tablet Take 1 tablet by mouth daily.        . methotrexate (RHEUMATREX) 2.5 MG tablet Take 2.5 mg by mouth once a week. Caution:Chemotherapy. Protect from light.        No Known Allergies  History  Substance Use Topics  . Smoking status: Never Smoker   . Smokeless tobacco:  Never Used  . Alcohol Use: Yes     occasionally    History reviewed. No pertinent family history.  Review of Systems A comprehensive review of systems was negative.  Objective:   Patient Vitals for the past 8 hrs:  BP Temp Temp src Pulse Resp SpO2  12/03/11 0911 130/79 mmHg 99.3 F (37.4 C) Oral 86  16  98 %           ECG: no prior ECG.  Data Review CBC:  Lab Results  Component Value Date   WBC 8.3 11/29/2011   RBC 4.46 11/29/2011    Assessment:Severe macromastia   Active Problems:  * No active hospital problems. *    Plan:   Bilateral breast reductions  Patient is a 56 y.o. female presents with macromastia. Onset of symptoms was gradual starting several months ago with gradually worsening course since that time. The pain is located both breasts. Patient describes the pain as severe continuous and rated as severe. Pain has been associated with rashes. . Symptoms are aggravated by lifting. Symptoms improve withno lifting. Past history includes rashes and shoulder pitting.  Previous studies include mammagraphy.  There are no active problems to display for this patient.  Past Medical History  Diagnosis Date  . Sleep apnea  no CPAP use  . Hypertension     under control; has been on med. x 10 yrs.  . Rheumatoid arthritis   . Macromastia 11/2011    Past Surgical History  Procedure Date  . Abdominal hysterectomy     complete  . Carpal tunnel release     bilat.  . Toe surgery     bilat. 5th toes    Prescriptions prior to admission  Medication Sig Dispense Refill  . calcium carbonate (OS-CAL) 600 MG TABS Take 600 mg by mouth 2 (two) times daily with a meal.        . cholecalciferol (VITAMIN D) 1000 UNITS tablet Take 1,000 Units by mouth daily.        . folic acid (FOLVITE) 1 MG tablet Take 1 mg by mouth daily. 5 tabs. daily AM       . hydroxychloroquine (PLAQUENIL) 200 MG tablet Take by mouth 2 (two) times daily.        Marland Kitchen losartan-hydrochlorothiazide  (HYZAAR) 100-25 MG per tablet Take 1 tablet by mouth daily.        . methotrexate (RHEUMATREX) 2.5 MG tablet Take 2.5 mg by mouth once a week. Caution:Chemotherapy. Protect from light.        No Known Allergies  History  Substance Use Topics  . Smoking status: Never Smoker   . Smokeless tobacco: Never Used  . Alcohol Use: Yes     occasionally    History reviewed. No pertinent family history.  Review of Systems A comprehensive review of systems was negative.  Objective:   Patient Vitals for the past 8 hrs:  BP Temp Temp src Pulse Resp SpO2  12/03/11 0911 130/79 mmHg 99.3 F (37.4 C) Oral 86  16  98 %           ECG: no prior ECG.  Data Review CBC:  Lab Results  Component Value Date   WBC 8.3 11/29/2011   RBC 4.46 11/29/2011    Assessment:Severe macromastia   Active Problems:  * No active hospital problems. *    Plan:   Bilateral breast reductions Activity (include date of return to work if known) As tolerated: NO showers NO driving No heavy activities  Diet:regular No restrictions:  Wound Care: Keep dressing clean & dry  Do not change dressings For Abdominoplasties wear abdominal binder Special Instructions: Do not raise arms up Continue to empty, recharge, & record drainage from J-P drains &/or Hemovacs 2-3 times a day, as needed. Call Doctor if any unusual problems occur such as pain, excessive Bleeding, unrelieved Nausea/vomiting, Fever &/or chills When lying down, keep head elevated on 2-3 pillows or back-rest For Addominoplasties the Jack-knife position Follow-up appointment: Please call the office.  The patient received discharge instruction from:___________________________________________   Patient signature ________________________________________ / Date___________    Signature of individual providing instructions/ Date________________            Breast Reduction Care After Refer to this sheet in the next few weeks. These instructions  provide you with information on caring for yourself after your procedure. Your caregiver may also give you more specific instructions. Your treatment has been planned according to current medical practices, but problems sometimes occur. Call your caregiver if you have any problems or questions after your procedure. HOME CARE INSTRUCTIONS  Do not lift more than 5 pounds with one arm, or 10 pounds with both arms, for 1 month.   Do not sleep on your stomach for 4 to 6 weeks.  Do not do vigorous exercise such as bouncing, aerobics, or jumping for 6 weeks. Walking is not restricted.   Do not drive while you are taking prescription pain medicine.   Avoid prolonged sun exposure.   Keep dressings dry and clean  Measure jp drainage every 12 hrs and measure   You may slowly go back to your normal diet. Start with a light meal and increase as comfortable.   You may shower 24 hours after your drains are removed unless instructed differently by your caregiver.   Take your pain medicine as prescribed. Discomfort is normal after breast reduction surgery.   Keep the head of your bed elevated 40 degrees   : Call the office if you notice:  You have a fever.   You notice drainage from the incision that smells bad.   You have persistent pain.   You have persistent bleeding from the incision or nipple discharge.   You develop increased swelling or swelling that is greater in one breast than in the other.  MAKE SURE YOU:   Understand these instructions.   Will watch your condition.   Will get help right away if you are not doing well or get worse.  Document Released: 07/03/2004 Document Revised: 08/01/2011 Document Reviewed: 02/12/2008 [facility] Patient Information 2012 [place].

## 2017-08-12 ENCOUNTER — Ambulatory Visit (HOSPITAL_BASED_OUTPATIENT_CLINIC_OR_DEPARTMENT_OTHER): Payer: BLUE CROSS/BLUE SHIELD | Admitting: Anesthesiology

## 2017-08-12 ENCOUNTER — Encounter (HOSPITAL_BASED_OUTPATIENT_CLINIC_OR_DEPARTMENT_OTHER)
Admission: RE | Disposition: A | Discharge status: Home / Self Care | Payer: Self-pay | Source: Ambulatory Visit | Attending: Specialist

## 2017-08-12 ENCOUNTER — Ambulatory Visit (HOSPITAL_BASED_OUTPATIENT_CLINIC_OR_DEPARTMENT_OTHER)
Admission: RE | Admit: 2017-08-12 | Discharge: 2017-08-12 | Disposition: A | Discharge status: Home / Self Care | Payer: BLUE CROSS/BLUE SHIELD | Source: Ambulatory Visit | Attending: Specialist | Admitting: Specialist

## 2017-08-12 ENCOUNTER — Encounter (HOSPITAL_BASED_OUTPATIENT_CLINIC_OR_DEPARTMENT_OTHER): Payer: Self-pay | Admitting: Anesthesiology

## 2017-08-12 DIAGNOSIS — Z885 Allergy status to narcotic agent status: Secondary | ICD-10-CM | POA: Diagnosis not present

## 2017-08-12 DIAGNOSIS — Z8041 Family history of malignant neoplasm of ovary: Secondary | ICD-10-CM | POA: Diagnosis not present

## 2017-08-12 DIAGNOSIS — L304 Erythema intertrigo: Secondary | ICD-10-CM | POA: Diagnosis not present

## 2017-08-12 DIAGNOSIS — D242 Benign neoplasm of left breast: Secondary | ICD-10-CM | POA: Insufficient documentation

## 2017-08-12 DIAGNOSIS — Z841 Family history of disorders of kidney and ureter: Secondary | ICD-10-CM | POA: Insufficient documentation

## 2017-08-12 DIAGNOSIS — N6081 Other benign mammary dysplasias of right breast: Secondary | ICD-10-CM | POA: Insufficient documentation

## 2017-08-12 DIAGNOSIS — Z7983 Long term (current) use of bisphosphonates: Secondary | ICD-10-CM | POA: Insufficient documentation

## 2017-08-12 DIAGNOSIS — Z90710 Acquired absence of both cervix and uterus: Secondary | ICD-10-CM | POA: Diagnosis not present

## 2017-08-12 DIAGNOSIS — Z881 Allergy status to other antibiotic agents status: Secondary | ICD-10-CM | POA: Diagnosis not present

## 2017-08-12 DIAGNOSIS — M1990 Unspecified osteoarthritis, unspecified site: Secondary | ICD-10-CM | POA: Insufficient documentation

## 2017-08-12 DIAGNOSIS — M542 Cervicalgia: Secondary | ICD-10-CM | POA: Diagnosis not present

## 2017-08-12 DIAGNOSIS — E042 Nontoxic multinodular goiter: Secondary | ICD-10-CM | POA: Diagnosis not present

## 2017-08-12 DIAGNOSIS — N62 Hypertrophy of breast: Secondary | ICD-10-CM | POA: Insufficient documentation

## 2017-08-12 DIAGNOSIS — M546 Pain in thoracic spine: Secondary | ICD-10-CM | POA: Diagnosis not present

## 2017-08-12 DIAGNOSIS — N6011 Diffuse cystic mastopathy of right breast: Secondary | ICD-10-CM | POA: Diagnosis not present

## 2017-08-12 HISTORY — DX: Hypertrophy of breast: N62

## 2017-08-12 HISTORY — PX: BREAST REDUCTION SURGERY: SHX8

## 2017-08-12 HISTORY — PX: LIPOSUCTION: SHX10

## 2017-08-12 LAB — POCT HEMOGLOBIN-HEMACUE: Hemoglobin: 12.3 g/dL (ref 12.0–15.0)

## 2017-08-12 SURGERY — MAMMOPLASTY, REDUCTION
Anesthesia: General | Site: Breast | Laterality: Bilateral

## 2017-08-12 MED ORDER — CHLORHEXIDINE GLUCONATE CLOTH 2 % EX PADS: 6.0000 | MEDICATED_PAD | Freq: Once | CUTANEOUS | Status: DC

## 2017-08-12 MED ORDER — LIDOCAINE 2% (20 MG/ML) 5 ML SYRINGE
INTRAMUSCULAR | Status: DC | PRN
  Administered 2017-08-12: 100 mg via INTRAVENOUS

## 2017-08-12 MED ORDER — DEXAMETHASONE SODIUM PHOSPHATE 10 MG/ML IJ SOLN
INTRAMUSCULAR | Status: AC
  Filled 2017-08-12: qty 1

## 2017-08-12 MED ORDER — MEPERIDINE HCL 25 MG/ML IJ SOLN: 6.2500 mg | INTRAMUSCULAR | Status: DC | PRN

## 2017-08-12 MED ORDER — OXYCODONE HCL 5 MG PO TABS
5.0000 mg | ORAL_TABLET | Freq: Once | ORAL | Status: AC | PRN
  Administered 2017-08-12: 5 mg via ORAL

## 2017-08-12 MED ORDER — LACTATED RINGERS IV SOLN
INTRAVENOUS | Status: DC
  Administered 2017-08-12 (×3): via INTRAVENOUS

## 2017-08-12 MED ORDER — SCOPOLAMINE 1 MG/3DAYS TD PT72
1.0000 | MEDICATED_PATCH | Freq: Once | TRANSDERMAL | Status: AC | PRN
  Administered 2017-08-12: 1.5 mg via TRANSDERMAL
  Administered 2017-08-12: 1 via TRANSDERMAL

## 2017-08-12 MED ORDER — FENTANYL CITRATE (PF) 100 MCG/2ML IJ SOLN
INTRAMUSCULAR | Status: AC
  Filled 2017-08-12: qty 2

## 2017-08-12 MED ORDER — HYDROMORPHONE HCL 1 MG/ML IJ SOLN
0.2500 mg | INTRAMUSCULAR | Status: DC | PRN
  Administered 2017-08-12: 0.5 mg via INTRAVENOUS

## 2017-08-12 MED ORDER — METOCLOPRAMIDE HCL 5 MG/ML IJ SOLN
INTRAMUSCULAR | Status: AC
  Filled 2017-08-12: qty 2

## 2017-08-12 MED ORDER — SODIUM BICARBONATE 4 % IV SOLN
INTRAVENOUS | Status: DC | PRN
  Administered 2017-08-12: 12.5 mL

## 2017-08-12 MED ORDER — SCOPOLAMINE 1 MG/3DAYS TD PT72
MEDICATED_PATCH | TRANSDERMAL | Status: AC
  Filled 2017-08-12: qty 1

## 2017-08-12 MED ORDER — EPINEPHRINE PF 1 MG/ML IJ SOLN
INTRAMUSCULAR | Status: DC | PRN
  Administered 2017-08-12: 1 mL

## 2017-08-12 MED ORDER — PROPOFOL 500 MG/50ML IV EMUL
INTRAVENOUS | Status: AC
  Filled 2017-08-12: qty 50

## 2017-08-12 MED ORDER — SUCCINYLCHOLINE CHLORIDE 20 MG/ML IJ SOLN
INTRAMUSCULAR | Status: DC | PRN
  Administered 2017-08-12: 100 mg via INTRAVENOUS

## 2017-08-12 MED ORDER — OXYCODONE HCL 5 MG/5ML PO SOLN: 5.0000 mg | Freq: Once | ORAL | Status: AC | PRN

## 2017-08-12 MED ORDER — LIDOCAINE-EPINEPHRINE 0.5 %-1:200000 IJ SOLN
INTRAMUSCULAR | Status: DC | PRN
Start: 2017-08-12 — End: 2017-08-12
  Administered 2017-08-12: 50 mL

## 2017-08-12 MED ORDER — PROMETHAZINE HCL 25 MG/ML IJ SOLN: 6.2500 mg | INTRAMUSCULAR | Status: DC | PRN

## 2017-08-12 MED ORDER — LACTATED RINGERS IV SOLN: INTRAVENOUS | Status: DC | PRN

## 2017-08-12 MED ORDER — MIDAZOLAM HCL 2 MG/2ML IJ SOLN
INTRAMUSCULAR | Status: AC
  Filled 2017-08-12: qty 2

## 2017-08-12 MED ORDER — OXYCODONE HCL 5 MG PO TABS
ORAL_TABLET | ORAL | Status: AC
  Filled 2017-08-12: qty 1

## 2017-08-12 MED ORDER — MIDAZOLAM HCL 2 MG/2ML IJ SOLN
1.0000 mg | INTRAMUSCULAR | Status: DC | PRN
  Administered 2017-08-12: 2 mg via INTRAVENOUS

## 2017-08-12 MED ORDER — PROPOFOL 10 MG/ML IV BOLUS
INTRAVENOUS | Status: DC | PRN
  Administered 2017-08-12: 150 mg via INTRAVENOUS

## 2017-08-12 MED ORDER — METOCLOPRAMIDE HCL 5 MG/ML IJ SOLN
10.0000 mg | Freq: Four times a day (QID) | INTRAMUSCULAR | Status: DC | PRN
  Administered 2017-08-12: 10 mg via INTRAVENOUS

## 2017-08-12 MED ORDER — LIDOCAINE-EPINEPHRINE 0.5 %-1:200000 IJ SOLN
INTRAMUSCULAR | Status: AC
  Filled 2017-08-12: qty 1

## 2017-08-12 MED ORDER — LIDOCAINE 2% (20 MG/ML) 5 ML SYRINGE
INTRAMUSCULAR | Status: AC
  Filled 2017-08-12: qty 5

## 2017-08-12 MED ORDER — DEXAMETHASONE SODIUM PHOSPHATE 4 MG/ML IJ SOLN
INTRAMUSCULAR | Status: DC | PRN
  Administered 2017-08-12: 10 mg via INTRAVENOUS

## 2017-08-12 MED ORDER — EPINEPHRINE 30 MG/30ML IJ SOLN
INTRAMUSCULAR | Status: AC
  Filled 2017-08-12: qty 1

## 2017-08-12 MED ORDER — FENTANYL CITRATE (PF) 100 MCG/2ML IJ SOLN
50.0000 ug | INTRAMUSCULAR | Status: AC | PRN
  Administered 2017-08-12: 50 ug via INTRAVENOUS
  Administered 2017-08-12: 25 ug via INTRAVENOUS
  Administered 2017-08-12: 100 ug via INTRAVENOUS
  Administered 2017-08-12: 50 ug via INTRAVENOUS
  Administered 2017-08-12: 25 ug via INTRAVENOUS

## 2017-08-12 MED ORDER — ONDANSETRON HCL 4 MG/2ML IJ SOLN
INTRAMUSCULAR | Status: AC
  Filled 2017-08-12: qty 2

## 2017-08-12 MED ORDER — SODIUM BICARBONATE 4 % IV SOLN
INTRAVENOUS | Status: AC
  Filled 2017-08-12: qty 5

## 2017-08-12 MED ORDER — HYDROMORPHONE HCL 1 MG/ML IJ SOLN
INTRAMUSCULAR | Status: AC
  Filled 2017-08-12: qty 0.5

## 2017-08-12 MED ORDER — SUCCINYLCHOLINE CHLORIDE 200 MG/10ML IV SOSY
PREFILLED_SYRINGE | INTRAVENOUS | Status: AC
  Filled 2017-08-12: qty 10

## 2017-08-12 MED ORDER — CEFAZOLIN SODIUM-DEXTROSE 2-4 GM/100ML-% IV SOLN
2.0000 g | INTRAVENOUS | Status: AC
  Administered 2017-08-12: 2 g via INTRAVENOUS

## 2017-08-12 MED ORDER — CEFAZOLIN SODIUM-DEXTROSE 2-4 GM/100ML-% IV SOLN
INTRAVENOUS | Status: AC
  Filled 2017-08-12: qty 100

## 2017-08-12 SURGICAL SUPPLY — 90 items
BAG DECANTER FOR FLEXI CONT (MISCELLANEOUS) ×2 IMPLANT
BENZOIN TINCTURE PRP APPL 2/3 (GAUZE/BANDAGES/DRESSINGS) ×4 IMPLANT
BINDER BREAST XLRG (GAUZE/BANDAGES/DRESSINGS) ×2 IMPLANT
BINDER BREAST XXLRG (GAUZE/BANDAGES/DRESSINGS) IMPLANT
BLADE KNIFE  20 PERSONNA (BLADE) ×2
BLADE KNIFE 20 PERSONNA (BLADE) ×2 IMPLANT
BLADE KNIFE PERSONA 10 (BLADE) ×2 IMPLANT
BLADE KNIFE PERSONA 15 (BLADE) ×2 IMPLANT
BNDG COHESIVE 4X5 TAN STRL (GAUZE/BANDAGES/DRESSINGS) IMPLANT
BNDG GAUZE ELAST 4 BULKY (GAUZE/BANDAGES/DRESSINGS) IMPLANT
CANISTER SUCT 1200ML W/VALVE (MISCELLANEOUS) ×2 IMPLANT
CLEANER CAUTERY TIP 5X5 PAD (MISCELLANEOUS) IMPLANT
COVER BACK TABLE 60X90IN (DRAPES) ×2 IMPLANT
COVER MAYO STAND STRL (DRAPES) ×2 IMPLANT
DECANTER SPIKE VIAL GLASS SM (MISCELLANEOUS) ×4 IMPLANT
DRAIN CHANNEL 10F 3/8 F FF (DRAIN) ×4 IMPLANT
DRAPE LAPAROSCOPIC ABDOMINAL (DRAPES) ×2 IMPLANT
DRAPE LAPAROTOMY 100X72 PEDS (DRAPES) IMPLANT
DRAPE U-SHAPE 76X120 STRL (DRAPES) IMPLANT
DRSG PAD ABDOMINAL 8X10 ST (GAUZE/BANDAGES/DRESSINGS) ×8 IMPLANT
ELECT REM PT RETURN 9FT ADLT (ELECTROSURGICAL) ×2
ELECTRODE REM PT RTRN 9FT ADLT (ELECTROSURGICAL) ×1 IMPLANT
EVACUATOR SILICONE 100CC (DRAIN) ×4 IMPLANT
GAUZE SPONGE 4X4 12PLY STRL (GAUZE/BANDAGES/DRESSINGS) ×4 IMPLANT
GAUZE SPONGE 4X4 12PLY STRL LF (GAUZE/BANDAGES/DRESSINGS) IMPLANT
GAUZE XEROFORM 1X8 LF (GAUZE/BANDAGES/DRESSINGS) IMPLANT
GAUZE XEROFORM 5X9 LF (GAUZE/BANDAGES/DRESSINGS) ×4 IMPLANT
GLOVE BIO SURGEON STRL SZ 6.5 (GLOVE) ×2 IMPLANT
GLOVE BIOGEL M STRL SZ7.5 (GLOVE) ×2 IMPLANT
GLOVE BIOGEL PI IND STRL 7.0 (GLOVE) ×1 IMPLANT
GLOVE BIOGEL PI IND STRL 8 (GLOVE) ×1 IMPLANT
GLOVE BIOGEL PI IND STRL 8.5 (GLOVE) ×1 IMPLANT
GLOVE BIOGEL PI INDICATOR 7.0 (GLOVE) ×1
GLOVE BIOGEL PI INDICATOR 8 (GLOVE) ×1
GLOVE BIOGEL PI INDICATOR 8.5 (GLOVE) ×1
GLOVE ECLIPSE 7.0 STRL STRAW (GLOVE) ×2 IMPLANT
GOWN STRL REUS W/ TWL LRG LVL3 (GOWN DISPOSABLE) IMPLANT
GOWN STRL REUS W/ TWL XL LVL3 (GOWN DISPOSABLE) ×2 IMPLANT
GOWN STRL REUS W/TWL LRG LVL3 (GOWN DISPOSABLE)
GOWN STRL REUS W/TWL XL LVL3 (GOWN DISPOSABLE) ×2
IV LACTATED RINGERS 1000ML (IV SOLUTION) ×2 IMPLANT
IV NS 500ML (IV SOLUTION) ×1
IV NS 500ML BAXH (IV SOLUTION) ×1 IMPLANT
LINER CANISTER 1000CC FLEX (MISCELLANEOUS) ×2 IMPLANT
MARKER SKIN DUAL TIP RULER LAB (MISCELLANEOUS) ×2 IMPLANT
NDL SAFETY ECLIPSE 18X1.5 (NEEDLE) ×1 IMPLANT
NEEDLE HYPO 18GX1.5 SHARP (NEEDLE) ×1
NEEDLE HYPO 25X1 1.5 SAFETY (NEEDLE) IMPLANT
NEEDLE SPNL 18GX3.5 QUINCKE PK (NEEDLE) ×2 IMPLANT
NS IRRIG 1000ML POUR BTL (IV SOLUTION) ×2 IMPLANT
PACK BASIN DAY SURGERY FS (CUSTOM PROCEDURE TRAY) ×2 IMPLANT
PAD CLEANER CAUTERY TIP 5X5 (MISCELLANEOUS)
PEN SKIN MARKING BROAD TIP (MISCELLANEOUS) ×2 IMPLANT
PILLOW FOAM RUBBER ADULT (PILLOWS) ×2 IMPLANT
PIN SAFETY STERILE (MISCELLANEOUS) ×2 IMPLANT
SHEET MEDIUM DRAPE 40X70 STRL (DRAPES) IMPLANT
SHEETING SILICONE GEL EPI DERM (MISCELLANEOUS) IMPLANT
SLEEVE SCD COMPRESS KNEE MED (MISCELLANEOUS) ×2 IMPLANT
SPECIMEN JAR MEDIUM (MISCELLANEOUS) IMPLANT
SPECIMEN JAR X LARGE (MISCELLANEOUS) ×4 IMPLANT
SPONGE LAP 18X18 X RAY DECT (DISPOSABLE) ×8 IMPLANT
STOCKINETTE 4X48 STRL (DRAPES) IMPLANT
STOCKINETTE IMPERVIOUS LG (DRAPES) IMPLANT
STRIP CLOSURE SKIN 1/8X3 (GAUZE/BANDAGES/DRESSINGS) IMPLANT
STRIP SUTURE WOUND CLOSURE 1/2 (SUTURE) ×10 IMPLANT
SUT ETHILON 3 0 PS 1 (SUTURE) IMPLANT
SUT MNCRL AB 3-0 PS2 18 (SUTURE) ×12 IMPLANT
SUT MON AB 2-0 CT1 36 (SUTURE) IMPLANT
SUT MON AB 5-0 PS2 18 (SUTURE) ×4 IMPLANT
SUT PROLENE 3 0 PS 2 (SUTURE) ×12 IMPLANT
SUT PROLENE 4 0 PS 2 18 (SUTURE) IMPLANT
SUT VIC AB 0 CT1 27 (SUTURE)
SUT VIC AB 0 CT1 27XBRD ANBCTR (SUTURE) IMPLANT
SUT VLOC 180 0 24IN GS25 (SUTURE) ×2 IMPLANT
SUT VLOC 90 P-14 23 (SUTURE) ×2 IMPLANT
SYR 20CC LL (SYRINGE) IMPLANT
SYR 50ML LL SCALE MARK (SYRINGE) ×4 IMPLANT
SYR CONTROL 10ML LL (SYRINGE) IMPLANT
TAPE HYPAFIX 6X30 (GAUZE/BANDAGES/DRESSINGS) ×2 IMPLANT
TAPE MEASURE 72IN RETRACT (INSTRUMENTS) ×1
TAPE MEASURE LINEN 72IN RETRCT (INSTRUMENTS) ×1 IMPLANT
TAPE PAPER 1X10 WHT MICROPORE (GAUZE/BANDAGES/DRESSINGS) ×2 IMPLANT
TOWEL OR 17X24 6PK STRL BLUE (TOWEL DISPOSABLE) ×4 IMPLANT
TOWEL OR NON WOVEN STRL DISP B (DISPOSABLE) ×2 IMPLANT
TUBE CONNECTING 20X1/4 (TUBING) ×2 IMPLANT
TUBING INFILTRATION IT-10001 (TUBING) IMPLANT
TUBING SET GRADUATE ASPIR 12FT (MISCELLANEOUS) ×2 IMPLANT
UNDERPAD 30X30 (UNDERPADS AND DIAPERS) ×6 IMPLANT
VAC PENCILS W/TUBING CLEAR (MISCELLANEOUS) ×2 IMPLANT
YANKAUER SUCT BULB TIP NO VENT (SUCTIONS) ×2 IMPLANT

## 2017-08-12 NOTE — Brief Op Note (Signed)
08/12/2017  9:55 AM  PATIENT:  Lisa Bell  56 y.o. female  PRE-OPERATIVE DIAGNOSIS:  MACROMASTIA  POST-OPERATIVE DIAGNOSIS:  MACROMASTIA  PROCEDURE:  Procedure(s): MAMMARY REDUCTION  (BREAST) (Bilateral) LIPOSUCTION (Bilateral)  SURGEON:  Surgeon(s) and Role:    Cristine Polio, MD - Primary  PHYSICIAN ASSISTANT:   ASSISTANTS: none   ANESTHESIA:   general  EBL:  Total I/O In: 400 [I.V.:400] Out: -   BLOOD ADMINISTERED:none  DRAINS: (ee) Jackson-Pratt drain(s) with closed bulb suction in the number 10 JP drain in the right and left lateral axillary ares   LOCAL MEDICATIONS USED:  LIDOCAINE   SPECIMEN:  Excision  DISPOSITION OF SPECIMEN:  PATHOLOGY  COUNTS:  YES  TOURNIQUET:  * No tourniquets in log *  DICTATION: .Other Dictation: Dictation Number [id number]  PLAN OF CARE: Discharge to home after PACU  PATIENT DISPOSITION:  PACU - hemodynamically stable.   Delay start of Pharmacological VTE agent (>24hrs) due to surgical blood loss or risk of bleeding: yes

## 2017-08-12 NOTE — Discharge Instructions (Signed)
Breast Reduction °Care After °Refer to this sheet in the next few weeks. These instructions provide you with information on caring for yourself after your procedure. Your caregiver may also give you more specific instructions. Your treatment has been planned according to current medical practices, but problems sometimes occur. Call your caregiver if you have any problems or questions after your procedure. °HOME CARE INSTRUCTIONS °· Do not lift more than 5 pounds with one arm, or 10 pounds with both arms, for 1 month.  °· Do not sleep on your stomach for 4 to 6 weeks.  °· Do not do vigorous exercise such as bouncing, aerobics, or jumping for 6 weeks. Walking is not restricted.  °· Do not drive while you are taking prescription pain medicine.  °· Avoid prolonged sun exposure.  °· Keep dressings dry and clean °· Measure jp drainage every 12 hrs and measure  °· You may slowly go back to your normal diet. Start with a light meal and increase as comfortable.  °· You may shower 24 hours after your drains are removed unless instructed differently by your caregiver.  °· Take your pain medicine as prescribed. Discomfort is normal after breast reduction surgery.  °· Keep the head of your bed elevated 40 degrees °·  °: Call the office if you notice: °· You have a fever.  °· You notice drainage from the incision that smells bad.  °· You have persistent pain.  °· You have persistent bleeding from the incision or nipple discharge.  °· You develop increased swelling or swelling that is greater in one breast than in the other.  °MAKE SURE YOU:  °· Understand these instructions.  °· Will watch your condition.  °· Will get help right away if you are not doing well or get worse.  °Document Released: 07/03/2004 Document Revised: 08/01/2011 Document Reviewed: 02/12/2008 °[employer]® Patient Information ©2012 [employer]. ° ° ° °Post Anesthesia Home Care Instructions ° °Activity: °Get plenty of rest for the remainder of the day. A  responsible individual must stay with you for 24 hours following the procedure.  °For the next 24 hours, DO NOT: °-Drive a car °-Operate machinery °-Drink alcoholic beverages °-Take any medication unless instructed by your physician °-Make any legal decisions or sign important papers. ° °Meals: °Start with liquid foods such as gelatin or soup. Progress to regular foods as tolerated. Avoid greasy, spicy, heavy foods. If nausea and/or vomiting occur, drink only clear liquids until the nausea and/or vomiting subsides. Call your physician if vomiting continues. ° °Special Instructions/Symptoms: °Your throat may feel dry or sore from the anesthesia or the breathing tube placed in your throat during surgery. If this causes discomfort, gargle with warm salt water. The discomfort should disappear within 24 hours. ° °If you had a scopolamine patch placed behind your ear for the management of post- operative nausea and/or vomiting: ° °1. The medication in the patch is effective for 72 hours, after which it should be removed.  Wrap patch in a tissue and discard in the trash. Wash hands thoroughly with soap and water. °2. You may remove the patch earlier than 72 hours if you experience unpleasant side effects which may include dry mouth, dizziness or visual disturbances. °3. Avoid touching the patch. Wash your hands with soap and water after contact with the patch. °  ° °

## 2017-08-12 NOTE — Anesthesia Preprocedure Evaluation (Signed)
Anesthesia Evaluation  Patient identified by MRN, date of birth, ID band Patient awake    Reviewed: Allergy & Precautions, NPO status , Patient's Chart, lab work & pertinent test results  Airway Mallampati: II  TM Distance: >3 FB Neck ROM: Full    Dental no notable dental hx.    Pulmonary neg pulmonary ROS,    Pulmonary exam normal breath sounds clear to auscultation       Cardiovascular negative cardio ROS Normal cardiovascular exam Rhythm:Regular Rate:Normal     Neuro/Psych negative neurological ROS  negative psych ROS   GI/Hepatic negative GI ROS, Neg liver ROS,   Endo/Other  negative endocrine ROS  Renal/GU negative Renal ROS     Musculoskeletal negative musculoskeletal ROS (+) Arthritis ,   Abdominal   Peds  Hematology negative hematology ROS (+)   Anesthesia Other Findings   Reproductive/Obstetrics negative OB ROS                             Anesthesia Physical Anesthesia Plan  ASA: II  Anesthesia Plan: General   Post-op Pain Management:    Induction: Intravenous  PONV Risk Score and Plan: 4 or greater and Ondansetron, Dexamethasone, Midazolam and Scopolamine patch - Pre-op  Airway Management Planned: Oral ETT  Additional Equipment:   Intra-op Plan:   Post-operative Plan: Extubation in OR  Informed Consent: I have reviewed the patients History and Physical, chart, labs and discussed the procedure including the risks, benefits and alternatives for the proposed anesthesia with the patient or authorized representative who has indicated his/her understanding and acceptance.   Dental advisory given  Plan Discussed with: CRNA  Anesthesia Plan Comments:         Anesthesia Quick Evaluation

## 2017-08-12 NOTE — Anesthesia Postprocedure Evaluation (Signed)
Anesthesia Post Note  Patient: [name]  Procedure(s) Performed: Procedure(s) (LRB): MAMMARY REDUCTION  (BREAST) (Bilateral) LIPOSUCTION (Bilateral)     Patient location during evaluation: PACU Anesthesia Type: General Level of consciousness: awake and alert Pain management: pain level controlled Vital Signs Assessment: post-procedure vital signs reviewed and stable Respiratory status: spontaneous breathing, nonlabored ventilation, respiratory function stable and patient connected to nasal cannula oxygen Cardiovascular status: blood pressure returned to baseline and stable Postop Assessment: no signs of nausea or vomiting Anesthetic complications: no    Last Vitals:  Vitals:   08/12/17 1145 08/12/17 1147  BP: 118/79   Pulse: (!) 59 (!) 56  Resp: 14 14  Temp:    SpO2: 100% 100%    Last Pain:  Vitals:   08/12/17 1023  TempSrc:   PainSc: [place]

## 2017-08-12 NOTE — Transfer of Care (Signed)
Immediate Anesthesia Transfer of Care Note  Patient: Lisa Bell  Procedure(s) Performed: Procedure(s): MAMMARY REDUCTION  (BREAST) (Bilateral) LIPOSUCTION (Bilateral)  Patient Location: PACU  Anesthesia Type:General  Level of Consciousness: sedated and responds to stimulation  Airway & Oxygen Therapy: Patient Spontanous Breathing and Patient connected to face mask oxygen  Post-op Assessment: Report given to RN and Post -op Vital signs reviewed and stable  Post vital signs: Reviewed and stable  Last Vitals:  Vitals:   08/12/17 0658  BP: 99/64  Pulse: 65  Resp: 18  Temp: 36.6 C  SpO2: 100%    Last Pain:  Vitals:   08/12/17 0658  TempSrc: Oral         Complications: No apparent anesthesia complications

## 2017-08-12 NOTE — Anesthesia Procedure Notes (Signed)
Procedure Name: Intubation Date/Time: 08/12/2017 7:45 AM Performed by: Lyndee Leo Pre-anesthesia Checklist: Patient identified, Emergency Drugs available, Suction available and Patient being monitored Patient Re-evaluated:Patient Re-evaluated prior to induction Oxygen Delivery Method: Circle system utilized Preoxygenation: Pre-oxygenation with 100% oxygen Induction Type: IV induction Ventilation: Mask ventilation without difficulty Laryngoscope Size: Mac and 3 Grade View: Grade I Tube type: Oral Tube size: 7.0 mm Number of attempts: 1 Airway Equipment and Method: Stylet and Oral airway Placement Confirmation: ETT inserted through vocal cords under direct vision,  positive ETCO2 and breath sounds checked- equal and bilateral Secured at: 21 cm Tube secured with: Tape Dental Injury: Teeth and Oropharynx as per pre-operative assessment

## 2017-08-13 ENCOUNTER — Encounter (HOSPITAL_BASED_OUTPATIENT_CLINIC_OR_DEPARTMENT_OTHER): Payer: Self-pay | Admitting: Specialist

## 2017-08-13 NOTE — Op Note (Signed)
NAMESRUTHI, MAURER             ACCOUNT NO.:  [id number]  MEDICAL RECORD NO.:  [id number]  LOCATION:                                 FACILITY:  PHYSICIAN:  Berneta Sages L. Towanda Malkin, M.D.   DATE OF BIRTH:  DATE OF PROCEDURE:  08/12/2017 DATE OF DISCHARGE:                              OPERATIVE REPORT   INDICATIONS:  A 56 year old lady with severe macromastia, back pain, shoulder pain, intertriginous changes secondary to the above as well as special bras to prevent most __________ at the shoulder regions.  PROCEDURES DONE:  Bilateral breast reductions, excision of accessory breast tissue.  ANESTHESIA:  General.  DESCRIPTION OF PROCEDURE:  Preoperatively, the patient was sat up and drawn for the reduction mammoplasty using the inferior pedicle technique, re-marking the nipple-areolar complexes back to 20 cm from the suprasternal notch.  After drawings were done, the patient underwent general anesthesia, intubated orally, prep was done to the chest and breast areas in routine fashion with Hibiclens soap and solution walled off with sterile towels and drapes so as to make a sterile field. Tumescent solution was injected into the right and left sides of the breast 500 mL per side.  This was allowed to sit up.  The wounds were then scored with a #15 blade.  The skin of the inferior pedicle was de- epithelialized with #20 blades.  Hemostasis was maintained.  Next, the medial and lateral fatty dermal pedicles were excised down the underlying pectoralis major fascia, then a new keyhole area was also reduced.  After proper hemostasis, the flaps were transposed and stayed with 3-0 Prolene suture.  Subcutaneous closure was done with 3-0 Monocryl x2 layers in a running subcuticular stitch of 3-0 Monocryl and 5-0 Monocryl throughout the inverted T after excess accessory breast tissue was removed as well.  The wounds were drained with #10 fully fluted Blake drains, which were placed in depths  of wound about the lateral most portion of the incision, secured with 3-0 Prolene suture. The wounds were clean.  Steri-Strips and sterile dressings applied.  The periareolar incisions and the periareolar __________ were inspected showing good blood supply.  Next, the Steri-Strips, sterile dressings were applied on all the areas, 4x4s, ABDs, Hypafix tape, and a breast garment.  She tolerated the procedures very well.  Estimated blood loss less than 100 mL.  COMPLICATIONS:  None.     Odella Aquas. Towanda Malkin, M.D.   ______________________________ Odella Aquas. Towanda Malkin, M.D.    Elie Confer  D:  08/12/2017  T:  08/12/2017  Job:  [id number]

## 2017-08-19 DIAGNOSIS — M79641 Pain in right hand: Secondary | ICD-10-CM | POA: Diagnosis not present

## 2017-08-27 DIAGNOSIS — Z0100 Encounter for examination of eyes and vision without abnormal findings: Secondary | ICD-10-CM | POA: Diagnosis not present

## 2017-10-14 ENCOUNTER — Other Ambulatory Visit: Payer: Self-pay | Admitting: Physician Assistant

## 2017-10-14 ENCOUNTER — Ambulatory Visit
Admission: RE | Admit: 2017-10-14 | Discharge: 2017-10-14 | Disposition: A | Discharge status: Home / Self Care | Payer: BLUE CROSS/BLUE SHIELD | Source: Ambulatory Visit | Attending: Physician Assistant | Admitting: Physician Assistant

## 2017-10-14 DIAGNOSIS — R1031 Right lower quadrant pain: Secondary | ICD-10-CM

## 2017-10-14 DIAGNOSIS — N281 Cyst of kidney, acquired: Secondary | ICD-10-CM | POA: Diagnosis not present

## 2018-02-18 DIAGNOSIS — Z13220 Encounter for screening for lipoid disorders: Secondary | ICD-10-CM | POA: Diagnosis not present

## 2018-02-18 DIAGNOSIS — M8580 Other specified disorders of bone density and structure, unspecified site: Secondary | ICD-10-CM | POA: Diagnosis not present

## 2018-02-18 DIAGNOSIS — Z0000 Encounter for general adult medical examination without abnormal findings: Secondary | ICD-10-CM | POA: Diagnosis not present

## 2018-02-18 DIAGNOSIS — R7302 Impaired glucose tolerance (oral): Secondary | ICD-10-CM | POA: Diagnosis not present

## 2018-04-22 ENCOUNTER — Other Ambulatory Visit: Payer: Self-pay | Admitting: Physician Assistant

## 2018-04-22 DIAGNOSIS — Z1231 Encounter for screening mammogram for malignant neoplasm of breast: Secondary | ICD-10-CM

## 2018-05-02 ENCOUNTER — Emergency Department (HOSPITAL_COMMUNITY)
Admission: EM | Admit: 2018-05-02 | Discharge: 2018-05-02 | Disposition: A | Discharge status: Home / Self Care | Payer: BLUE CROSS/BLUE SHIELD | Attending: Emergency Medicine | Admitting: Emergency Medicine

## 2018-05-02 ENCOUNTER — Emergency Department (HOSPITAL_COMMUNITY): Payer: BLUE CROSS/BLUE SHIELD

## 2018-05-02 ENCOUNTER — Encounter (HOSPITAL_COMMUNITY): Payer: Self-pay | Admitting: Radiology

## 2018-05-02 DIAGNOSIS — R42 Dizziness and giddiness: Secondary | ICD-10-CM | POA: Diagnosis not present

## 2018-05-02 DIAGNOSIS — F985 Adult onset fluency disorder: Secondary | ICD-10-CM

## 2018-05-02 DIAGNOSIS — Z79899 Other long term (current) drug therapy: Secondary | ICD-10-CM | POA: Insufficient documentation

## 2018-05-02 DIAGNOSIS — M542 Cervicalgia: Secondary | ICD-10-CM | POA: Insufficient documentation

## 2018-05-02 DIAGNOSIS — G4489 Other headache syndrome: Secondary | ICD-10-CM | POA: Diagnosis not present

## 2018-05-02 DIAGNOSIS — R51 Headache: Secondary | ICD-10-CM | POA: Diagnosis not present

## 2018-05-02 DIAGNOSIS — Z89021 Acquired absence of right finger(s): Secondary | ICD-10-CM | POA: Diagnosis not present

## 2018-05-02 DIAGNOSIS — S0990XA Unspecified injury of head, initial encounter: Secondary | ICD-10-CM | POA: Diagnosis not present

## 2018-05-02 DIAGNOSIS — S199XXA Unspecified injury of neck, initial encounter: Secondary | ICD-10-CM | POA: Diagnosis not present

## 2018-05-02 LAB — CBG MONITORING, ED: Glucose-Capillary: 65 mg/dL (ref 65–99)

## 2018-05-02 LAB — CBC WITH DIFFERENTIAL/PLATELET
Basophils Absolute: 0 10*3/uL (ref 0.0–0.1)
Basophils Relative: 0 %
Eosinophils Absolute: 0 10*3/uL (ref 0.0–0.7)
Eosinophils Relative: 1 %
HCT: 39.5 % (ref 36.0–46.0)
Hemoglobin: 12.7 g/dL (ref 12.0–15.0)
Lymphocytes Relative: 37 %
Lymphs Abs: 1.2 10*3/uL (ref 0.7–4.0)
MCH: 30 pg (ref 26.0–34.0)
MCHC: 32.2 g/dL (ref 30.0–36.0)
MCV: 93.2 fL (ref 78.0–100.0)
Monocytes Absolute: 0.2 10*3/uL (ref 0.1–1.0)
Monocytes Relative: 6 %
Neutro Abs: 1.8 10*3/uL (ref 1.7–7.7)
Neutrophils Relative %: 56 %
Platelets: 210 10*3/uL (ref 150–400)
RBC: 4.24 MIL/uL (ref 3.87–5.11)
RDW: 14 % (ref 11.5–15.5)
WBC: 3.3 10*3/uL — ABNORMAL LOW (ref 4.0–10.5)

## 2018-05-02 LAB — RAPID URINE DRUG SCREEN, HOSP PERFORMED
Amphetamines: NOT DETECTED
Barbiturates: NOT DETECTED
Benzodiazepines: NOT DETECTED
Cocaine: NOT DETECTED
Opiates: NOT DETECTED
Tetrahydrocannabinol: NOT DETECTED

## 2018-05-02 LAB — BASIC METABOLIC PANEL
Anion gap: 6 (ref 5–15)
BUN: 15 mg/dL (ref 6–20)
CO2: 26 mmol/L (ref 22–32)
Calcium: 9.8 mg/dL (ref 8.9–10.3)
Chloride: 109 mmol/L (ref 101–111)
Creatinine, Ser: 0.85 mg/dL (ref 0.44–1.00)
GFR calc Af Amer: >60 mL/min (ref >60)
GFR calc non Af Amer: >60 mL/min (ref >60)
Glucose, Bld: 95 mg/dL (ref 65–99)
Potassium: 4.3 mmol/L (ref 3.5–5.1)
Sodium: 141 mmol/L (ref 135–145)
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
## 2018-05-02 MED ORDER — IOPAMIDOL (ISOVUE-370) INJECTION 76%
100.0000 mL | Freq: Once | INTRAVENOUS | Status: AC | PRN
  Administered 2018-05-02: 100 mL via INTRAVENOUS

## 2018-05-02 MED ORDER — IOPAMIDOL (ISOVUE-370) INJECTION 76%
INTRAVENOUS | Status: AC
  Filled 2018-05-02: qty 100

## 2018-05-02 MED ORDER — DIAZEPAM 2 MG PO TABS
2.0000 mg | ORAL_TABLET | Freq: Once | ORAL | Status: AC
  Administered 2018-05-02: 2 mg via ORAL
  Filled 2018-05-02: qty 1
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
## 2018-05-02 NOTE — ED Notes (Signed)
Bed: WA21 Expected date:  Expected time:  Means of arrival:  Comments: Triage
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
## 2018-07-24 DIAGNOSIS — M542 Cervicalgia: Secondary | ICD-10-CM | POA: Diagnosis not present

## 2018-07-24 DIAGNOSIS — M25519 Pain in unspecified shoulder: Secondary | ICD-10-CM | POA: Diagnosis not present

## 2018-07-24 DIAGNOSIS — S199XXA Unspecified injury of neck, initial encounter: Secondary | ICD-10-CM | POA: Diagnosis not present

## 2018-07-29 ENCOUNTER — Other Ambulatory Visit: Payer: Self-pay | Admitting: Physician Assistant

## 2018-07-29 DIAGNOSIS — R109 Unspecified abdominal pain: Secondary | ICD-10-CM | POA: Diagnosis not present

## 2018-07-29 DIAGNOSIS — R1084 Generalized abdominal pain: Secondary | ICD-10-CM

## 2018-08-06 ENCOUNTER — Other Ambulatory Visit: Payer: BLUE CROSS/BLUE SHIELD

## 2018-08-13 DIAGNOSIS — M25512 Pain in left shoulder: Secondary | ICD-10-CM | POA: Diagnosis not present

## 2018-08-13 DIAGNOSIS — M542 Cervicalgia: Secondary | ICD-10-CM | POA: Diagnosis not present

## 2018-08-13 DIAGNOSIS — M545 Low back pain: Secondary | ICD-10-CM | POA: Diagnosis not present

## 2018-08-13 DIAGNOSIS — M25511 Pain in right shoulder: Secondary | ICD-10-CM | POA: Diagnosis not present

## 2018-08-22 DIAGNOSIS — R1011 Right upper quadrant pain: Secondary | ICD-10-CM | POA: Diagnosis not present

## 2018-08-25 DIAGNOSIS — M542 Cervicalgia: Secondary | ICD-10-CM | POA: Diagnosis not present

## 2018-08-25 DIAGNOSIS — M4306 Spondylolysis, lumbar region: Secondary | ICD-10-CM | POA: Diagnosis not present

## 2018-08-29 DIAGNOSIS — M4306 Spondylolysis, lumbar region: Secondary | ICD-10-CM | POA: Diagnosis not present

## 2018-08-29 DIAGNOSIS — M542 Cervicalgia: Secondary | ICD-10-CM | POA: Diagnosis not present

## 2018-09-02 DIAGNOSIS — M542 Cervicalgia: Secondary | ICD-10-CM | POA: Diagnosis not present

## 2018-09-02 DIAGNOSIS — M4306 Spondylolysis, lumbar region: Secondary | ICD-10-CM | POA: Diagnosis not present

## 2018-09-04 DIAGNOSIS — M4306 Spondylolysis, lumbar region: Secondary | ICD-10-CM | POA: Diagnosis not present

## 2018-09-04 DIAGNOSIS — M542 Cervicalgia: Secondary | ICD-10-CM | POA: Diagnosis not present

## 2018-09-08 DIAGNOSIS — M542 Cervicalgia: Secondary | ICD-10-CM | POA: Diagnosis not present

## 2018-09-08 DIAGNOSIS — M4306 Spondylolysis, lumbar region: Secondary | ICD-10-CM | POA: Diagnosis not present

## 2018-09-09 ENCOUNTER — Ambulatory Visit: Payer: BLUE CROSS/BLUE SHIELD

## 2018-09-10 DIAGNOSIS — M545 Low back pain: Secondary | ICD-10-CM | POA: Diagnosis not present

## 2018-09-10 DIAGNOSIS — M542 Cervicalgia: Secondary | ICD-10-CM | POA: Diagnosis not present

## 2018-09-15 DIAGNOSIS — M4306 Spondylolysis, lumbar region: Secondary | ICD-10-CM | POA: Diagnosis not present

## 2018-09-15 DIAGNOSIS — M542 Cervicalgia: Secondary | ICD-10-CM | POA: Diagnosis not present

## 2018-09-17 DIAGNOSIS — M4306 Spondylolysis, lumbar region: Secondary | ICD-10-CM | POA: Diagnosis not present

## 2018-09-17 DIAGNOSIS — M542 Cervicalgia: Secondary | ICD-10-CM | POA: Diagnosis not present

## 2018-09-30 DIAGNOSIS — G5791 Unspecified mononeuropathy of right lower limb: Secondary | ICD-10-CM | POA: Diagnosis not present

## 2018-10-24 DIAGNOSIS — Z01419 Encounter for gynecological examination (general) (routine) without abnormal findings: Secondary | ICD-10-CM | POA: Diagnosis not present

## 2018-10-24 DIAGNOSIS — Z6827 Body mass index (BMI) 27.0-27.9, adult: Secondary | ICD-10-CM | POA: Diagnosis not present

## 2019-01-06 ENCOUNTER — Ambulatory Visit: Payer: BLUE CROSS/BLUE SHIELD

## 2019-01-19 ENCOUNTER — Ambulatory Visit
Admission: RE | Admit: 2019-01-19 | Discharge: 2019-01-19 | Disposition: A | Discharge status: Home / Self Care | Payer: BLUE CROSS/BLUE SHIELD | Source: Ambulatory Visit | Attending: Physician Assistant | Admitting: Physician Assistant

## 2019-01-19 DIAGNOSIS — Z1231 Encounter for screening mammogram for malignant neoplasm of breast: Secondary | ICD-10-CM

## 2019-02-25 DIAGNOSIS — E041 Nontoxic single thyroid nodule: Secondary | ICD-10-CM | POA: Diagnosis not present

## 2019-02-25 DIAGNOSIS — Z0000 Encounter for general adult medical examination without abnormal findings: Secondary | ICD-10-CM | POA: Diagnosis not present

## 2019-02-25 DIAGNOSIS — R7302 Impaired glucose tolerance (oral): Secondary | ICD-10-CM | POA: Diagnosis not present

## 2019-02-25 DIAGNOSIS — M8580 Other specified disorders of bone density and structure, unspecified site: Secondary | ICD-10-CM | POA: Diagnosis not present

## 2019-04-07 ENCOUNTER — Ambulatory Visit: Payer: Self-pay | Admitting: Podiatry

## 2019-04-13 DIAGNOSIS — Z0100 Encounter for examination of eyes and vision without abnormal findings: Secondary | ICD-10-CM | POA: Diagnosis not present

## 2019-04-14 ENCOUNTER — Encounter: Payer: Self-pay | Admitting: Podiatry

## 2019-04-14 ENCOUNTER — Ambulatory Visit: Payer: BLUE CROSS/BLUE SHIELD | Admitting: Podiatry

## 2019-04-14 ENCOUNTER — Other Ambulatory Visit: Payer: Self-pay

## 2019-04-14 DIAGNOSIS — L603 Nail dystrophy: Secondary | ICD-10-CM | POA: Diagnosis not present

## 2019-04-14 DIAGNOSIS — L600 Ingrowing nail: Secondary | ICD-10-CM | POA: Diagnosis not present

## 2019-04-14 DIAGNOSIS — B351 Tinea unguium: Secondary | ICD-10-CM | POA: Diagnosis not present

## 2019-04-14 MED ORDER — NEOMYCIN-POLYMYXIN-HC 3.5-10000-1 OT SOLN: OTIC | 0 refills | Status: DC

## 2019-04-14 NOTE — Patient Instructions (Signed)

## 2019-04-15 ENCOUNTER — Encounter: Payer: Self-pay | Admitting: Podiatry

## 2019-04-15 DIAGNOSIS — H00021 Hordeolum internum right upper eyelid: Secondary | ICD-10-CM | POA: Diagnosis not present

## 2019-04-15 NOTE — Progress Notes (Signed)
Subjective:  Patient ID: Lisa Bell, female    DOB: Apr 17, 1961,  MRN: [id number] HPI Chief Complaint  Patient presents with  . Toe Pain    Hallux bilateral (medial borders) - tender x 2 months, unable to get pedicures  . Skin Problem    Concerned about peeling patches of skin that itch on both feet  . New Patient (Initial Visit)    58 y.o. female presents with the above complaint.   ROS: Denies fever chills nausea vomiting muscle aches pains calf pain back pain chest pain shortness of breath.  Past Medical History:  Diagnosis Date  . Arthritis   . Goiter   . Macromastia 08/07/2017  . Rash, skin    Past Surgical History:  Procedure Laterality Date  . ABDOMINAL HYSTERECTOMY    . BREAST REDUCTION SURGERY Bilateral 08/12/2017   Procedure: MAMMARY REDUCTION  (BREAST);  Surgeon: Cristine Polio, MD;  Location: [facility];  Service: Plastics;  Laterality: Bilateral;  . BREAST SURGERY     cyst (benign)  . FINGER AMPUTATION  08/08/11   right  . LABIAL ADHESION LYSIS    . LIPOSUCTION Bilateral 08/12/2017   Procedure: LIPOSUCTION;  Surgeon: Cristine Polio, MD;  Location: [facility];  Service: Plastics;  Laterality: Bilateral;  . REDUCTION MAMMAPLASTY Bilateral 2018  . TUBAL LIGATION      Current Outpatient Medications:  .  alendronate (FOSAMAX) 70 MG tablet, Take 70 mg by mouth once a week. Take with a full glass of water on an empty stomach., Disp: , Rfl:  .  atorvastatin (LIPITOR) 10 MG tablet, TK 1 T PO QD, Disp: , Rfl:  .  cyclobenzaprine (FLEXERIL) 10 MG tablet, TK 1 T PO  HS, Disp: , Rfl:  .  DIVIGEL 1 MG/GM GEL, TAKE 1 PACKAGE BY MOUTH ONCE DAILY, Disp: , Rfl: 4 .  neomycin-polymyxin-hydrocortisone (CORTISPORIN) OTIC solution, Apply 1-2 drops to toe after soaking twice a day, Disp: 10 mL, Rfl: 0  Allergies  Allergen Reactions  . Sulfa Antibiotics   . Codeine Other (See Comments)    GI UPSET  . Septra [Bactrim] Other (See Comments)    GI UPSET  . Tetracyclines & Related Other (See Comments)    GI UPSET   Review of Systems Objective:  There were no vitals filed for this visit.  General: Well developed, nourished, in no acute distress, alert and oriented x3   Dermatological: Skin is warm, dry and supple bilateral. Nails x 10 are well maintained; remaining integument appears unremarkable at this time. There are no open sores, no preulcerative lesions, no rash or signs of infection present.  Vascular: Dorsalis Pedis artery and Posterior Tibial artery pedal pulses are 2/4 bilateral with immedate capillary fill time. Pedal hair growth present. No varicosities and no lower extremity edema present bilateral.   Neruologic: Grossly intact via light touch bilateral. Vibratory intact via tuning fork bilateral. Protective threshold with Semmes Wienstein monofilament intact to all pedal sites bilateral. Patellar and Achilles deep tendon reflexes 2+ bilateral. No Babinski or clonus noted bilateral.   Musculoskeletal: No gross boney pedal deformities bilateral. No pain, crepitus, or limitation noted with foot and ankle range of motion bilateral. Muscular strength 5/5 in all groups tested bilateral.  Gait: Unassisted, Nonantalgic.    Radiographs:  None taken  Assessment & Plan:   Assessment: Ingrown nails tibial borders hallux bilateral probable vesicular tinea pedis bilateral most likely secondary to nail fungus or nail dystrophy.  Plan: Discussed etiology pathology conservative versus  surgical therapies at this point chemical matricectomy's were performed to the tibial border of the hallux bilateral.  She tolerated the procedure well without complications and was provided with both oral and written home-going instructions for the care and soaking of the toe.  She was also provided with a prescription for Cortisporin Otic to be applied twice daily after soaking.  Samples of the toenails were taken today and sent for pathologic  evaluation.  Toes involved to be 1 through 5 bilaterally.  I will follow-up with her in 2 weeks for check on the matrixectomy in 4 weeks for follow-up regarding the onychomycosis.  I did not provide her with any prescription medication for the athlete's foot at this point.     Max T. [place], [place]

## 2019-04-23 ENCOUNTER — Telehealth: Payer: Self-pay | Admitting: *Deleted

## 2019-04-23 MED ORDER — IBUPROFEN 800 MG PO TABS: 800.0000 mg | ORAL_TABLET | Freq: Three times a day (TID) | ORAL | 0 refills | Status: AC | PRN

## 2019-04-23 NOTE — Telephone Encounter (Signed)
Dr. Milinda Pointer ordered Ibuprofen 800mg  #30 one tablet every 8 hours prn foot pain. Orders called to [facility].

## 2019-04-23 NOTE — Telephone Encounter (Signed)
Pt states she had ingrown toenail procedure on 04/06/2019, by Dr. Milinda Pointer, and is still having shooting and occasional throbbing pain.

## 2019-04-23 NOTE — Telephone Encounter (Signed)
I called pt and told her she may have varying degrees of discomfort for about 4-6 weeks gradually decreasing the further she got from her procedure date, to continue the soaks and ibuprofen. Pt states she has a dry hard scab, without redness, swelling or drainage. I told pt then she could stop the drops also but continue the ibuprofen for the discomfort. Pt states understanding and the ibuprofen 800mg  helped the best. I told pt I would inform Dr. Milinda Pointer and she could contact her pharmacy. Pt asked for next appt date. I informed pt she would be seen in office 04/30/2019 at 3:15pm.

## 2019-04-30 ENCOUNTER — Encounter: Payer: Self-pay | Admitting: Podiatry

## 2019-04-30 ENCOUNTER — Ambulatory Visit: Payer: BLUE CROSS/BLUE SHIELD | Admitting: Podiatry

## 2019-04-30 ENCOUNTER — Other Ambulatory Visit: Payer: Self-pay

## 2019-04-30 VITALS — Temp 98.0°F | Resp 16

## 2019-04-30 DIAGNOSIS — L600 Ingrowing nail: Secondary | ICD-10-CM

## 2019-04-30 DIAGNOSIS — Z9889 Other specified postprocedural states: Secondary | ICD-10-CM

## 2019-04-30 DIAGNOSIS — L603 Nail dystrophy: Secondary | ICD-10-CM

## 2019-04-30 DIAGNOSIS — Z79899 Other long term (current) drug therapy: Secondary | ICD-10-CM

## 2019-04-30 MED ORDER — TERBINAFINE HCL 250 MG PO TABS: 250.0000 mg | ORAL_TABLET | Freq: Every day | ORAL | 0 refills | Status: DC

## 2019-04-30 NOTE — Progress Notes (Signed)
She presents today for follow-up of matrixectomy tibial borders hallux bilaterally.  She states that they are quite painful 5 out of 10 and she stopped soaking a week ago.  Objective: Vital signs are stable she is alert and oriented x3 toes are mildly tender on palpation there is no erythema edema cellulitis drainage or odor a scab is present overlying the surgical space.  Toenails remain thick and discolored with a rash to the plantar aspect of the bilateral foot consistent with tinea pedis.  Pathology results from the toenails do demonstrate onychomycosis and tinea pedis.  Assessment: Tinea pedis onychomycosis well-healing matrixectomy's hallux bilateral.  Plan: Discussed etiology pathology conservative versus surgical therapies at this point started her on Lamisil 250 mg tablets 1 p.o. daily provided her with information regarding the medication.  Also requested a liver profile should this come back abnormal I will notify her immediately.  Otherwise I will follow-up with her in 1 month.  I did recommend that she start soaking the toes at least every other day Epson salts and warm water continue the Cortisporin Otic and cover during the daytime but leave open at bedtime.  I will follow-up with her in 30 days.

## 2019-04-30 NOTE — Patient Instructions (Signed)

## 2019-05-01 DIAGNOSIS — Z79899 Other long term (current) drug therapy: Secondary | ICD-10-CM | POA: Diagnosis not present

## 2019-05-02 LAB — HEPATIC FUNCTION PANEL
AG Ratio: 1.6 (calc) (ref 1.0–2.5)
ALT: 17 U/L (ref 6–29)
AST: 20 U/L (ref 10–35)
Albumin: 4.2 g/dL (ref 3.6–5.1)
Alkaline phosphatase (APISO): 37 U/L (ref 37–153)
Bilirubin, Direct: 0.1 mg/dL (ref 0.0–0.2)
Globulin: 2.6 g/dL (calc) (ref 1.9–3.7)
Indirect Bilirubin: 0.2 mg/dL (calc) (ref 0.2–1.2)
Total Bilirubin: 0.3 mg/dL (ref 0.2–1.2)
Total Protein: 6.8 g/dL (ref 6.1–8.1)

## 2019-05-04 ENCOUNTER — Telehealth: Payer: Self-pay | Admitting: Podiatry

## 2019-05-04 NOTE — Telephone Encounter (Signed)
Patient wants to know about her lab results to begin taking medication.

## 2019-05-05 ENCOUNTER — Telehealth: Payer: Self-pay | Admitting: *Deleted

## 2019-05-06 NOTE — Telephone Encounter (Signed)
Pt called for lab results and instructions. I informed pt the blood work results were within normal limits and she could begin the lamisil, and to return in 30 days. Pt states she has an appt to check her toenail procedure. I reviewed appt schedule and 05/28/2019 will be okay for pt to also be followed up for lamisil.

## 2019-05-06 NOTE — Telephone Encounter (Signed)
Hepatic function of 05/01/2019 are within normal limits to begin Lamisil.

## 2019-05-06 NOTE — Telephone Encounter (Signed)
Murlean Caller, CMA transferred pt's message of 10:20am to my voicemail at 4:52pm.

## 2019-05-06 NOTE — Telephone Encounter (Signed)
Labs look great. Continue medication.

## 2019-05-06 NOTE — Telephone Encounter (Signed)
Unable to leave a message mailbox if full.

## 2019-05-28 ENCOUNTER — Ambulatory Visit: Payer: BC Managed Care – PPO | Admitting: Podiatry

## 2019-05-28 ENCOUNTER — Other Ambulatory Visit: Payer: Self-pay

## 2019-05-28 VITALS — Temp 98.0°F

## 2019-05-28 DIAGNOSIS — L603 Nail dystrophy: Secondary | ICD-10-CM

## 2019-05-28 DIAGNOSIS — Z79899 Other long term (current) drug therapy: Secondary | ICD-10-CM

## 2019-05-28 MED ORDER — TERBINAFINE HCL 250 MG PO TABS: 250.0000 mg | ORAL_TABLET | Freq: Every day | ORAL | 0 refills | Status: DC

## 2019-05-28 NOTE — Progress Notes (Signed)
She presents today for follow-up of her matrixectomy hallux left she is also presents for follow-up of her Lamisil.  She denies fever chills nausea vomiting muscle aches pains calf pain back pain chest pain shortness of breath denies any itching or rashes.  She states that her medial aspect of her hallux still hurts where the ingrown was removed and she points to the distal medial aspect of the toe.  Objective: Vital signs are stable she is alert and oriented x3.  Tibial border of the hallux left has a small amount of reactive hyperkeratosis in it and has gone on to heal uneventfully.  There is no sign of ingrown nail present at all though she does have a callus to the distal medial aspect of the toe from rubbing in her work shoes.  This is the area that is painful for her and I recommended that she discontinue use of the shoe.  Her toenails are unchanged at this point from the use of her Lamisil.  Assessment: Long-term therapy with Lamisil secondary to onychomycosis.  Pain secondary to poor shoe gear distal medial aspect of the hallux left.  Plan: Discussed etiology pathology conservative or surgical therapies at this point I requested a liver profile and refilled her prescription for Lamisil for 90 more pills.  I debrided the reactive hyperkeratotic tissue along the tibial border of the nail bed.  Instructed her to purchase a different pair of work shoes.  Follow-up with her in 3 months

## 2019-05-29 DIAGNOSIS — Z79899 Other long term (current) drug therapy: Secondary | ICD-10-CM | POA: Diagnosis not present

## 2019-05-30 LAB — HEPATIC FUNCTION PANEL
AG Ratio: 1.6 (calc) (ref 1.0–2.5)
ALT: 17 U/L (ref 6–29)
AST: 18 U/L (ref 10–35)
Albumin: 4.1 g/dL (ref 3.6–5.1)
Alkaline phosphatase (APISO): 37 U/L (ref 37–153)
Bilirubin, Direct: 0.1 mg/dL (ref 0.0–0.2)
Globulin: 2.6 g/dL (calc) (ref 1.9–3.7)
Indirect Bilirubin: 0.2 mg/dL (calc) (ref 0.2–1.2)
Total Bilirubin: 0.3 mg/dL (ref 0.2–1.2)
Total Protein: 6.7 g/dL (ref 6.1–8.1)

## 2019-06-01 DIAGNOSIS — E7800 Pure hypercholesterolemia, unspecified: Secondary | ICD-10-CM | POA: Diagnosis not present

## 2019-07-02 ENCOUNTER — Telehealth: Payer: Self-pay | Admitting: Podiatry

## 2019-07-02 NOTE — Telephone Encounter (Signed)
Pt came in for foot fungus/ she has started the drops and now the toenail is wanting to fall off. Pt would like for nurse to call her. She is schedule for an appointment on Tuesday as well

## 2019-07-06 NOTE — Telephone Encounter (Signed)
You can let her know that it is ok if the nail comes off. Another should regrow.  Continue the medication as the new nail is growing in. If that is all we are looking at on Tuesday and the nail comes off she does not need to come in.

## 2019-07-06 NOTE — Telephone Encounter (Signed)
Pt states the nail is still connected on one side and she would like to come in to have it looked at.

## 2019-07-07 ENCOUNTER — Other Ambulatory Visit: Payer: Self-pay

## 2019-07-07 ENCOUNTER — Encounter: Payer: Self-pay | Admitting: Podiatry

## 2019-07-07 ENCOUNTER — Ambulatory Visit: Payer: BC Managed Care – PPO | Admitting: Podiatry

## 2019-07-07 VITALS — Temp 98.2°F

## 2019-07-07 DIAGNOSIS — L603 Nail dystrophy: Secondary | ICD-10-CM

## 2019-07-07 NOTE — Progress Notes (Signed)
She presents today concerned that her left toenail is starting to fall off.  She is concerned that the matrixectomy did it.  She continues to take her Lamisil regularly.  Objective: Vital signs are stable she is alert and oriented x3.  Pulses are palpable.  Hallux nail left is peeling off very easily I would ahead and remove the rest of it.  There was no pain no bleeding.  New nail is already growing beneath it.  Assessment: Nail dystrophy long-term onychomycosis with long-term therapy secondary to Lamisil.  Plan: Martin Majestic ahead and remove the nail for her today follow-up with her in October her regular scheduled appointment.

## 2019-08-04 DIAGNOSIS — R42 Dizziness and giddiness: Secondary | ICD-10-CM | POA: Diagnosis not present

## 2019-08-05 DIAGNOSIS — R42 Dizziness and giddiness: Secondary | ICD-10-CM | POA: Diagnosis not present

## 2019-09-01 ENCOUNTER — Other Ambulatory Visit: Payer: Self-pay

## 2019-09-01 ENCOUNTER — Encounter: Payer: Self-pay | Admitting: Podiatry

## 2019-09-01 ENCOUNTER — Ambulatory Visit: Payer: BC Managed Care – PPO | Admitting: Podiatry

## 2019-09-01 ENCOUNTER — Ambulatory Visit (INDEPENDENT_AMBULATORY_CARE_PROVIDER_SITE_OTHER): Payer: BC Managed Care – PPO

## 2019-09-01 DIAGNOSIS — L603 Nail dystrophy: Secondary | ICD-10-CM

## 2019-09-01 DIAGNOSIS — M898X7 Other specified disorders of bone, ankle and foot: Secondary | ICD-10-CM | POA: Diagnosis not present

## 2019-09-02 ENCOUNTER — Encounter: Payer: Self-pay | Admitting: Podiatry

## 2019-09-02 NOTE — Progress Notes (Signed)
She presents today for follow-up of her nail fungus.  States that she is completed 120 days but still has 30 tablets left.  She denies any change in her past medical history medications allergies surgeries and social history.  Complains of tenderness to the tip of the toe hallux left.  Objective: Vital signs are stable alert and oriented x3.  Pulses are palpable.  There is no erythema edema cellulitis drainage odor the majority of her tenderness is noted at the very distal aspect of the hallux.  It appears that her toenail where it had developed a new nail was trying to digging into the tissues.  I did a lateral x-rays to make sure there was no spur there is just a very minimal distal spur which should not be enough to cause her the symptoms.  Is also noted that there is a callused area where the nail was rubbing.  I very carefully debrided all of the reactive hyperkeratotic tissue debrided the nail alleviating the patient's pain upon palpation  Assessment: Nail pain.  No exostosis.  Plan: Debrided the area today follow-up with her in a couple months instructed her to keep the area moisturized as the new nail grows out.

## 2019-09-04 DIAGNOSIS — R2689 Other abnormalities of gait and mobility: Secondary | ICD-10-CM | POA: Diagnosis not present

## 2019-09-04 DIAGNOSIS — K148 Other diseases of tongue: Secondary | ICD-10-CM | POA: Diagnosis not present

## 2019-11-17 DIAGNOSIS — Z01419 Encounter for gynecological examination (general) (routine) without abnormal findings: Secondary | ICD-10-CM | POA: Diagnosis not present

## 2019-12-01 ENCOUNTER — Ambulatory Visit: Payer: BC Managed Care – PPO | Admitting: Podiatry

## 2019-12-17 ENCOUNTER — Other Ambulatory Visit: Payer: Self-pay | Admitting: Physician Assistant

## 2019-12-17 ENCOUNTER — Encounter: Payer: Self-pay | Admitting: Podiatry

## 2019-12-17 ENCOUNTER — Ambulatory Visit: Payer: BC Managed Care – PPO | Admitting: Podiatry

## 2019-12-17 ENCOUNTER — Other Ambulatory Visit: Payer: Self-pay

## 2019-12-17 DIAGNOSIS — L603 Nail dystrophy: Secondary | ICD-10-CM

## 2019-12-17 DIAGNOSIS — Z1231 Encounter for screening mammogram for malignant neoplasm of breast: Secondary | ICD-10-CM

## 2019-12-17 DIAGNOSIS — M79676 Pain in unspecified toe(s): Secondary | ICD-10-CM

## 2019-12-17 DIAGNOSIS — B351 Tinea unguium: Secondary | ICD-10-CM | POA: Diagnosis not present

## 2019-12-20 ENCOUNTER — Encounter: Payer: Self-pay | Admitting: Podiatry

## 2019-12-20 NOTE — Progress Notes (Signed)
She presents today for follow-up of her nail fungus completed Lamisil x120 days states that they are looking okay but I got 1 or 2 toes that are hurting around the toenail.  Objective: Vital signs are stable she is alert and oriented x3. These toenails appear to be growing out and the curvature of the nail distally appears to be that they are growing in. She is still growing these nails out and appear to be doing quite well with it.  Assessment: I debrided these areas today for her to allow for pain relief. The nail should continue to growing out and alleviate the symptoms.  Plan: Debrided the nails. Follow-up with her as needed.

## 2019-12-24 DIAGNOSIS — M25561 Pain in right knee: Secondary | ICD-10-CM | POA: Insufficient documentation

## 2020-01-07 DIAGNOSIS — M7651 Patellar tendinitis, right knee: Secondary | ICD-10-CM | POA: Diagnosis not present

## 2020-01-07 DIAGNOSIS — M25561 Pain in right knee: Secondary | ICD-10-CM | POA: Diagnosis not present

## 2020-01-07 DIAGNOSIS — M2241 Chondromalacia patellae, right knee: Secondary | ICD-10-CM | POA: Diagnosis not present

## 2020-01-26 ENCOUNTER — Ambulatory Visit: Payer: BLUE CROSS/BLUE SHIELD

## 2020-02-04 DIAGNOSIS — R159 Full incontinence of feces: Secondary | ICD-10-CM | POA: Diagnosis not present

## 2020-02-13 ENCOUNTER — Ambulatory Visit: Payer: Self-pay | Attending: Internal Medicine

## 2020-02-13 DIAGNOSIS — Z23 Encounter for immunization: Secondary | ICD-10-CM

## 2020-02-13 NOTE — Progress Notes (Signed)
   Covid-19 Vaccination Clinic  Name:  Teeya Wygle    MRN: [id number] DOB: Jan 31, 1961  02/13/2020  Ms. Bills was observed post Covid-19 immunization for 15 minutes without incident. She was provided with Vaccine Information Sheet and instruction to access the V-Safe system.   Ms. Lamkins was instructed to call 911 with any severe reactions post vaccine: Marland Kitchen Difficulty breathing  . Swelling of face and throat  . A fast heartbeat  . A bad rash all over body  . Dizziness and weakness   Immunizations Administered    Name Date Dose VIS Date Route   Pfizer COVID-19 Vaccine 02/13/2020  9:34 AM 0.3 mL 11/13/2019 Intramuscular   Manufacturer: [place]   Lot: [id number]   [place]: [id number]

## 2020-02-18 DIAGNOSIS — M25521 Pain in right elbow: Secondary | ICD-10-CM | POA: Diagnosis not present

## 2020-02-23 ENCOUNTER — Other Ambulatory Visit: Payer: Self-pay

## 2020-02-23 ENCOUNTER — Ambulatory Visit
Admission: RE | Admit: 2020-02-23 | Discharge: 2020-02-23 | Disposition: A | Discharge status: Home / Self Care | Payer: BC Managed Care – PPO | Source: Ambulatory Visit | Attending: Physician Assistant | Admitting: Physician Assistant

## 2020-02-23 DIAGNOSIS — Z1231 Encounter for screening mammogram for malignant neoplasm of breast: Secondary | ICD-10-CM | POA: Diagnosis not present

## 2020-03-08 ENCOUNTER — Ambulatory Visit: Payer: BC Managed Care – PPO | Attending: Internal Medicine

## 2020-03-08 DIAGNOSIS — Z23 Encounter for immunization: Secondary | ICD-10-CM

## 2020-03-08 NOTE — Progress Notes (Signed)
   Covid-19 Vaccination Clinic  Name:  Lisa Bell    MRN: [id number] DOB: 01-02-1961  03/08/2020  Lisa Bell was observed post Covid-19 immunization for 15 minutes without incident. She was provided with Vaccine Information Sheet and instruction to access the V-Safe system.   Lisa Bell was instructed to call 911 with any severe reactions post vaccine: Marland Kitchen Difficulty breathing  . Swelling of face and throat  . A fast heartbeat  . A bad rash all over body  . Dizziness and weakness   Immunizations Administered    Name Date Dose VIS Date Route   Pfizer COVID-19 Vaccine 03/08/2020  3:27 PM 0.3 mL 11/13/2019 Intramuscular   Manufacturer: [place]   Lot: [id number]   [place]: [id number]

## 2020-03-11 DIAGNOSIS — E041 Nontoxic single thyroid nodule: Secondary | ICD-10-CM | POA: Diagnosis not present

## 2020-03-11 DIAGNOSIS — M8580 Other specified disorders of bone density and structure, unspecified site: Secondary | ICD-10-CM | POA: Diagnosis not present

## 2020-03-11 DIAGNOSIS — R7302 Impaired glucose tolerance (oral): Secondary | ICD-10-CM | POA: Diagnosis not present

## 2020-03-11 DIAGNOSIS — Z0000 Encounter for general adult medical examination without abnormal findings: Secondary | ICD-10-CM | POA: Diagnosis not present

## 2020-03-11 DIAGNOSIS — E7800 Pure hypercholesterolemia, unspecified: Secondary | ICD-10-CM | POA: Diagnosis not present

## 2020-03-11 DIAGNOSIS — Z5181 Encounter for therapeutic drug level monitoring: Secondary | ICD-10-CM | POA: Diagnosis not present

## 2020-03-14 ENCOUNTER — Other Ambulatory Visit: Payer: Self-pay | Admitting: Physician Assistant

## 2020-03-14 DIAGNOSIS — M8580 Other specified disorders of bone density and structure, unspecified site: Secondary | ICD-10-CM

## 2020-03-14 DIAGNOSIS — E041 Nontoxic single thyroid nodule: Secondary | ICD-10-CM

## 2020-03-17 DIAGNOSIS — Z1211 Encounter for screening for malignant neoplasm of colon: Secondary | ICD-10-CM | POA: Diagnosis not present

## 2020-03-18 ENCOUNTER — Other Ambulatory Visit: Payer: BC Managed Care – PPO

## 2020-03-18 ENCOUNTER — Ambulatory Visit
Admission: RE | Admit: 2020-03-18 | Discharge: 2020-03-18 | Disposition: A | Discharge status: Home / Self Care | Payer: BC Managed Care – PPO | Source: Ambulatory Visit | Attending: Physician Assistant | Admitting: Physician Assistant

## 2020-03-18 ENCOUNTER — Other Ambulatory Visit: Payer: Self-pay

## 2020-03-18 DIAGNOSIS — E041 Nontoxic single thyroid nodule: Secondary | ICD-10-CM

## 2020-03-22 DIAGNOSIS — M25521 Pain in right elbow: Secondary | ICD-10-CM | POA: Diagnosis not present

## 2020-03-25 ENCOUNTER — Ambulatory Visit
Admission: RE | Admit: 2020-03-25 | Discharge: 2020-03-25 | Disposition: A | Discharge status: Home / Self Care | Payer: BC Managed Care – PPO | Source: Ambulatory Visit | Attending: Physician Assistant | Admitting: Physician Assistant

## 2020-03-25 ENCOUNTER — Other Ambulatory Visit: Payer: Self-pay

## 2020-03-25 DIAGNOSIS — M85851 Other specified disorders of bone density and structure, right thigh: Secondary | ICD-10-CM | POA: Diagnosis not present

## 2020-03-25 DIAGNOSIS — M8580 Other specified disorders of bone density and structure, unspecified site: Secondary | ICD-10-CM

## 2020-03-25 DIAGNOSIS — Z780 Asymptomatic menopausal state: Secondary | ICD-10-CM | POA: Diagnosis not present

## 2020-04-05 DIAGNOSIS — R109 Unspecified abdominal pain: Secondary | ICD-10-CM | POA: Diagnosis not present

## 2020-04-30 DIAGNOSIS — N390 Urinary tract infection, site not specified: Secondary | ICD-10-CM | POA: Diagnosis not present

## 2020-07-05 DIAGNOSIS — R159 Full incontinence of feces: Secondary | ICD-10-CM | POA: Diagnosis not present

## 2020-07-05 DIAGNOSIS — R109 Unspecified abdominal pain: Secondary | ICD-10-CM | POA: Diagnosis not present

## 2020-07-13 ENCOUNTER — Ambulatory Visit
Admission: RE | Admit: 2020-07-13 | Discharge: 2020-07-13 | Disposition: A | Discharge status: Home / Self Care | Payer: BC Managed Care – PPO | Source: Ambulatory Visit | Attending: Gastroenterology | Admitting: Gastroenterology

## 2020-07-13 ENCOUNTER — Other Ambulatory Visit: Payer: Self-pay | Admitting: Gastroenterology

## 2020-07-13 DIAGNOSIS — R109 Unspecified abdominal pain: Secondary | ICD-10-CM

## 2020-09-27 DIAGNOSIS — R109 Unspecified abdominal pain: Secondary | ICD-10-CM | POA: Diagnosis not present

## 2020-09-27 DIAGNOSIS — Z01818 Encounter for other preprocedural examination: Secondary | ICD-10-CM | POA: Diagnosis not present

## 2020-09-29 ENCOUNTER — Other Ambulatory Visit: Payer: Self-pay | Admitting: Gastroenterology

## 2020-09-29 DIAGNOSIS — R109 Unspecified abdominal pain: Secondary | ICD-10-CM

## 2020-10-05 ENCOUNTER — Ambulatory Visit
Admission: RE | Admit: 2020-10-05 | Discharge: 2020-10-05 | Disposition: A | Discharge status: Home / Self Care | Payer: BC Managed Care – PPO | Source: Ambulatory Visit | Attending: Gastroenterology | Admitting: Gastroenterology

## 2020-10-05 DIAGNOSIS — K7689 Other specified diseases of liver: Secondary | ICD-10-CM | POA: Diagnosis not present

## 2020-10-05 DIAGNOSIS — R109 Unspecified abdominal pain: Secondary | ICD-10-CM

## 2020-11-08 DIAGNOSIS — H5213 Myopia, bilateral: Secondary | ICD-10-CM | POA: Diagnosis not present

## 2020-11-10 DIAGNOSIS — Z90710 Acquired absence of both cervix and uterus: Secondary | ICD-10-CM | POA: Diagnosis not present

## 2020-11-10 DIAGNOSIS — N951 Menopausal and female climacteric states: Secondary | ICD-10-CM | POA: Diagnosis not present

## 2020-11-10 DIAGNOSIS — N898 Other specified noninflammatory disorders of vagina: Secondary | ICD-10-CM | POA: Diagnosis not present

## 2020-11-10 DIAGNOSIS — Z01419 Encounter for gynecological examination (general) (routine) without abnormal findings: Secondary | ICD-10-CM | POA: Diagnosis not present

## 2020-12-10 ENCOUNTER — Other Ambulatory Visit: Payer: BC Managed Care – PPO

## 2020-12-10 DIAGNOSIS — Z20822 Contact with and (suspected) exposure to covid-19: Secondary | ICD-10-CM | POA: Diagnosis not present

## 2020-12-13 LAB — NOVEL CORONAVIRUS, NAA: SARS-CoV-2, NAA: NOT DETECTED

## 2020-12-20 ENCOUNTER — Ambulatory Visit: Payer: BC Managed Care – PPO | Admitting: Podiatry

## 2020-12-20 ENCOUNTER — Other Ambulatory Visit: Payer: Self-pay

## 2020-12-20 ENCOUNTER — Encounter: Payer: Self-pay | Admitting: Podiatry

## 2020-12-20 DIAGNOSIS — L600 Ingrowing nail: Secondary | ICD-10-CM | POA: Diagnosis not present

## 2020-12-20 NOTE — Progress Notes (Signed)
She presents today with a chief complaint of numbness and tingling in her big toes bilaterally.  She states that it hurts right in here as she points to the tibial border of the hallux bilaterally.  We performed chemical matricectomy's in the past and she done very well with that until recently.  Objective: Vital signs are stable is alert oriented x3 there is no erythema edema cellulitis drainage or odor she does have a callus along the medial border of the nail.  Most likely secondary to the mild hallux valgus deformities.  Assessment: Cannot rule out early neuropathy.  Particularly with her night time symptoms.  She does have mild hallux valgus resulting in reactive hyperkeratotic margin along the tibial border of the nail.  Plan: Discussed etiology pathology and surgical therapies debrided the reactive hyperkeratosis.  Did recommend that she follow-up with her primary care for evaluation of blood sugars.  She states that she has been told that she has prediabetes in the past.

## 2020-12-29 ENCOUNTER — Ambulatory Visit: Payer: BC Managed Care – PPO | Admitting: Podiatry

## 2020-12-30 DIAGNOSIS — M25562 Pain in left knee: Secondary | ICD-10-CM | POA: Diagnosis not present

## 2021-01-19 DIAGNOSIS — M25562 Pain in left knee: Secondary | ICD-10-CM | POA: Diagnosis not present

## 2021-01-26 DIAGNOSIS — S83242A Other tear of medial meniscus, current injury, left knee, initial encounter: Secondary | ICD-10-CM | POA: Diagnosis not present

## 2021-02-02 ENCOUNTER — Other Ambulatory Visit: Payer: Self-pay | Admitting: Physician Assistant

## 2021-02-02 DIAGNOSIS — R109 Unspecified abdominal pain: Secondary | ICD-10-CM | POA: Diagnosis not present

## 2021-02-02 DIAGNOSIS — Z01818 Encounter for other preprocedural examination: Secondary | ICD-10-CM | POA: Diagnosis not present

## 2021-02-02 DIAGNOSIS — Z1231 Encounter for screening mammogram for malignant neoplasm of breast: Secondary | ICD-10-CM

## 2021-03-28 ENCOUNTER — Inpatient Hospital Stay: Admission: RE | Admit: 2021-03-28 | Payer: BC Managed Care – PPO | Source: Ambulatory Visit

## 2021-04-26 DIAGNOSIS — R1013 Epigastric pain: Secondary | ICD-10-CM | POA: Diagnosis not present

## 2021-04-26 DIAGNOSIS — R1033 Periumbilical pain: Secondary | ICD-10-CM | POA: Diagnosis not present

## 2021-04-26 DIAGNOSIS — K2930 Chronic superficial gastritis without bleeding: Secondary | ICD-10-CM | POA: Diagnosis not present

## 2021-04-26 DIAGNOSIS — K3189 Other diseases of stomach and duodenum: Secondary | ICD-10-CM | POA: Diagnosis not present

## 2021-05-11 ENCOUNTER — Other Ambulatory Visit: Payer: Self-pay | Admitting: Gastroenterology

## 2021-05-11 DIAGNOSIS — R109 Unspecified abdominal pain: Secondary | ICD-10-CM

## 2021-05-16 ENCOUNTER — Other Ambulatory Visit: Payer: Self-pay

## 2021-05-16 ENCOUNTER — Ambulatory Visit
Admission: RE | Admit: 2021-05-16 | Discharge: 2021-05-16 | Disposition: A | Discharge status: Home / Self Care | Payer: BC Managed Care – PPO | Source: Ambulatory Visit | Attending: Physician Assistant | Admitting: Physician Assistant

## 2021-05-16 DIAGNOSIS — Z1231 Encounter for screening mammogram for malignant neoplasm of breast: Secondary | ICD-10-CM | POA: Diagnosis not present

## 2021-05-25 ENCOUNTER — Other Ambulatory Visit: Payer: BC Managed Care – PPO

## 2021-05-25 DIAGNOSIS — R519 Headache, unspecified: Secondary | ICD-10-CM | POA: Diagnosis not present

## 2021-05-25 DIAGNOSIS — F430 Acute stress reaction: Secondary | ICD-10-CM | POA: Diagnosis not present

## 2021-05-25 DIAGNOSIS — R110 Nausea: Secondary | ICD-10-CM | POA: Diagnosis not present

## 2021-09-21 ENCOUNTER — Other Ambulatory Visit: Payer: Self-pay | Admitting: Physician Assistant

## 2021-09-21 DIAGNOSIS — E041 Nontoxic single thyroid nodule: Secondary | ICD-10-CM

## 2021-09-28 ENCOUNTER — Other Ambulatory Visit: Payer: BC Managed Care – PPO

## 2021-10-05 ENCOUNTER — Ambulatory Visit
Admission: RE | Admit: 2021-10-05 | Discharge: 2021-10-05 | Disposition: A | Discharge status: Home / Self Care | Payer: BC Managed Care – PPO | Source: Ambulatory Visit | Attending: Physician Assistant | Admitting: Physician Assistant

## 2021-10-05 DIAGNOSIS — E041 Nontoxic single thyroid nodule: Secondary | ICD-10-CM | POA: Diagnosis not present

## 2021-10-12 ENCOUNTER — Other Ambulatory Visit: Payer: BC Managed Care – PPO

## 2021-10-31 ENCOUNTER — Other Ambulatory Visit: Payer: BC Managed Care – PPO

## 2021-11-08 DIAGNOSIS — H5211 Myopia, right eye: Secondary | ICD-10-CM | POA: Diagnosis not present

## 2021-12-06 ENCOUNTER — Ambulatory Visit
Admission: RE | Admit: 2021-12-06 | Discharge: 2021-12-06 | Disposition: A | Discharge status: Home / Self Care | Payer: BC Managed Care – PPO | Source: Ambulatory Visit | Attending: Gastroenterology | Admitting: Gastroenterology

## 2021-12-06 ENCOUNTER — Other Ambulatory Visit: Payer: Self-pay

## 2021-12-06 DIAGNOSIS — N200 Calculus of kidney: Secondary | ICD-10-CM | POA: Diagnosis not present

## 2021-12-06 DIAGNOSIS — G894 Chronic pain syndrome: Secondary | ICD-10-CM | POA: Diagnosis not present

## 2021-12-06 DIAGNOSIS — N281 Cyst of kidney, acquired: Secondary | ICD-10-CM | POA: Diagnosis not present

## 2021-12-06 DIAGNOSIS — R109 Unspecified abdominal pain: Secondary | ICD-10-CM

## 2021-12-06 DIAGNOSIS — K3189 Other diseases of stomach and duodenum: Secondary | ICD-10-CM | POA: Diagnosis not present

## 2021-12-06 MED ORDER — IOPAMIDOL (ISOVUE-300) INJECTION 61%
100.0000 mL | Freq: Once | INTRAVENOUS | Status: AC | PRN
  Administered 2021-12-06: 100 mL via INTRAVENOUS

## 2022-01-23 DIAGNOSIS — N6322 Unspecified lump in the left breast, upper inner quadrant: Secondary | ICD-10-CM | POA: Diagnosis not present

## 2022-01-23 DIAGNOSIS — Z01419 Encounter for gynecological examination (general) (routine) without abnormal findings: Secondary | ICD-10-CM | POA: Diagnosis not present

## 2022-01-23 DIAGNOSIS — Z90710 Acquired absence of both cervix and uterus: Secondary | ICD-10-CM | POA: Diagnosis not present

## 2022-01-25 DIAGNOSIS — M25562 Pain in left knee: Secondary | ICD-10-CM | POA: Diagnosis not present

## 2022-02-23 ENCOUNTER — Other Ambulatory Visit: Payer: Self-pay | Admitting: Obstetrics and Gynecology

## 2022-02-23 DIAGNOSIS — N6320 Unspecified lump in the left breast, unspecified quadrant: Secondary | ICD-10-CM

## 2022-03-13 ENCOUNTER — Ambulatory Visit
Admission: RE | Admit: 2022-03-13 | Discharge: 2022-03-13 | Disposition: A | Discharge status: Home / Self Care | Payer: BC Managed Care – PPO | Source: Ambulatory Visit | Attending: Obstetrics and Gynecology | Admitting: Obstetrics and Gynecology

## 2022-03-13 DIAGNOSIS — N6320 Unspecified lump in the left breast, unspecified quadrant: Secondary | ICD-10-CM

## 2022-03-13 DIAGNOSIS — R922 Inconclusive mammogram: Secondary | ICD-10-CM | POA: Diagnosis not present

## 2022-03-14 DIAGNOSIS — M25562 Pain in left knee: Secondary | ICD-10-CM | POA: Diagnosis not present

## 2022-03-14 DIAGNOSIS — M2392 Unspecified internal derangement of left knee: Secondary | ICD-10-CM | POA: Diagnosis not present

## 2022-03-25 DIAGNOSIS — M2392 Unspecified internal derangement of left knee: Secondary | ICD-10-CM | POA: Insufficient documentation

## 2022-05-08 ENCOUNTER — Other Ambulatory Visit: Payer: Self-pay | Admitting: Obstetrics and Gynecology

## 2022-05-08 DIAGNOSIS — Z1231 Encounter for screening mammogram for malignant neoplasm of breast: Secondary | ICD-10-CM

## 2022-05-15 ENCOUNTER — Ambulatory Visit: Payer: BC Managed Care – PPO | Admitting: Podiatry

## 2022-05-17 ENCOUNTER — Inpatient Hospital Stay: Admission: RE | Admit: 2022-05-17 | Payer: BC Managed Care – PPO | Source: Ambulatory Visit

## 2022-06-07 ENCOUNTER — Ambulatory Visit: Payer: BC Managed Care – PPO | Admitting: Podiatry

## 2022-06-22 DIAGNOSIS — H35341 Macular cyst, hole, or pseudohole, right eye: Secondary | ICD-10-CM | POA: Diagnosis not present

## 2022-07-13 DIAGNOSIS — H43391 Other vitreous opacities, right eye: Secondary | ICD-10-CM | POA: Diagnosis not present

## 2022-07-13 DIAGNOSIS — H2513 Age-related nuclear cataract, bilateral: Secondary | ICD-10-CM | POA: Diagnosis not present

## 2022-07-13 DIAGNOSIS — H35341 Macular cyst, hole, or pseudohole, right eye: Secondary | ICD-10-CM | POA: Diagnosis not present

## 2022-07-13 DIAGNOSIS — H43822 Vitreomacular adhesion, left eye: Secondary | ICD-10-CM | POA: Diagnosis not present

## 2022-07-23 DIAGNOSIS — H35341 Macular cyst, hole, or pseudohole, right eye: Secondary | ICD-10-CM | POA: Diagnosis not present

## 2022-07-24 DIAGNOSIS — H35341 Macular cyst, hole, or pseudohole, right eye: Secondary | ICD-10-CM | POA: Diagnosis not present

## 2022-07-31 DIAGNOSIS — H43822 Vitreomacular adhesion, left eye: Secondary | ICD-10-CM | POA: Diagnosis not present

## 2022-07-31 DIAGNOSIS — H35341 Macular cyst, hole, or pseudohole, right eye: Secondary | ICD-10-CM | POA: Diagnosis not present

## 2022-08-16 ENCOUNTER — Ambulatory Visit: Payer: BC Managed Care – PPO

## 2022-08-31 DIAGNOSIS — H35341 Macular cyst, hole, or pseudohole, right eye: Secondary | ICD-10-CM | POA: Diagnosis not present

## 2022-08-31 DIAGNOSIS — H43822 Vitreomacular adhesion, left eye: Secondary | ICD-10-CM | POA: Diagnosis not present

## 2022-09-04 ENCOUNTER — Ambulatory Visit: Admission: RE | Admit: 2022-09-04 | Payer: BC Managed Care – PPO | Source: Ambulatory Visit

## 2022-09-10 ENCOUNTER — Other Ambulatory Visit: Payer: Self-pay | Admitting: Physician Assistant

## 2022-09-10 DIAGNOSIS — E041 Nontoxic single thyroid nodule: Secondary | ICD-10-CM

## 2022-09-11 ENCOUNTER — Other Ambulatory Visit: Payer: BC Managed Care – PPO

## 2022-09-11 DIAGNOSIS — H20011 Primary iridocyclitis, right eye: Secondary | ICD-10-CM | POA: Diagnosis not present

## 2022-09-13 ENCOUNTER — Ambulatory Visit
Admission: RE | Admit: 2022-09-13 | Discharge: 2022-09-13 | Disposition: A | Discharge status: Home / Self Care | Payer: BC Managed Care – PPO | Source: Ambulatory Visit | Attending: Physician Assistant | Admitting: Physician Assistant

## 2022-09-13 DIAGNOSIS — E042 Nontoxic multinodular goiter: Secondary | ICD-10-CM | POA: Diagnosis not present

## 2022-09-13 DIAGNOSIS — E041 Nontoxic single thyroid nodule: Secondary | ICD-10-CM

## 2022-09-28 ENCOUNTER — Ambulatory Visit: Payer: BC Managed Care – PPO

## 2022-10-31 DIAGNOSIS — E049 Nontoxic goiter, unspecified: Secondary | ICD-10-CM | POA: Diagnosis not present

## 2022-11-09 DIAGNOSIS — H53143 Visual discomfort, bilateral: Secondary | ICD-10-CM | POA: Diagnosis not present

## 2022-11-29 ENCOUNTER — Ambulatory Visit: Payer: BC Managed Care – PPO

## 2022-12-11 DIAGNOSIS — E049 Nontoxic goiter, unspecified: Secondary | ICD-10-CM | POA: Insufficient documentation

## 2022-12-11 DIAGNOSIS — E042 Nontoxic multinodular goiter: Secondary | ICD-10-CM | POA: Diagnosis not present

## 2023-01-15 ENCOUNTER — Ambulatory Visit
Admission: RE | Admit: 2023-01-15 | Discharge: 2023-01-15 | Disposition: A | Discharge status: Home / Self Care | Payer: BC Managed Care – PPO | Source: Ambulatory Visit | Attending: Obstetrics and Gynecology | Admitting: Obstetrics and Gynecology

## 2023-01-15 DIAGNOSIS — Z1231 Encounter for screening mammogram for malignant neoplasm of breast: Secondary | ICD-10-CM

## 2023-01-24 DIAGNOSIS — Z01419 Encounter for gynecological examination (general) (routine) without abnormal findings: Secondary | ICD-10-CM | POA: Diagnosis not present

## 2023-01-25 DIAGNOSIS — N898 Other specified noninflammatory disorders of vagina: Secondary | ICD-10-CM | POA: Diagnosis not present

## 2023-01-25 DIAGNOSIS — Z01419 Encounter for gynecological examination (general) (routine) without abnormal findings: Secondary | ICD-10-CM | POA: Diagnosis not present

## 2023-03-12 DIAGNOSIS — Z1329 Encounter for screening for other suspected endocrine disorder: Secondary | ICD-10-CM | POA: Diagnosis not present

## 2023-03-12 DIAGNOSIS — N951 Menopausal and female climacteric states: Secondary | ICD-10-CM | POA: Diagnosis not present

## 2023-03-25 ENCOUNTER — Ambulatory Visit
Admission: EM | Admit: 2023-03-25 | Discharge: 2023-03-25 | Disposition: A | Discharge status: Home / Self Care | Payer: BC Managed Care – PPO | Attending: Emergency Medicine | Admitting: Emergency Medicine

## 2023-03-25 ENCOUNTER — Telehealth: Payer: Self-pay

## 2023-03-25 DIAGNOSIS — N898 Other specified noninflammatory disorders of vagina: Secondary | ICD-10-CM

## 2023-03-25 DIAGNOSIS — R200 Anesthesia of skin: Secondary | ICD-10-CM | POA: Diagnosis not present

## 2023-03-25 LAB — POCT URINALYSIS DIP (MANUAL ENTRY)
Bilirubin, UA: NEGATIVE
Blood, UA: NEGATIVE
Glucose, UA: NEGATIVE mg/dL
Leukocytes, UA: NEGATIVE
Nitrite, UA: NEGATIVE
Protein Ur, POC: NEGATIVE mg/dL
Spec Grav, UA: >=1.03 — AB (ref 1.010–1.025)
Urobilinogen, UA: 0.2 E.U./dL
pH, UA: 5.5 (ref 5.0–8.0)

## 2023-03-25 MED ORDER — FLUCONAZOLE 150 MG PO TABS: 150.0000 mg | ORAL_TABLET | ORAL | 0 refills | Status: DC

## 2023-03-25 NOTE — ED Provider Notes (Signed)
[facility]    CSN: [id number] Arrival date & time: 03/25/23  1620      History   Chief Complaint Chief Complaint  Patient presents with   Vaginal Itching    HPI Lisa Bell is a 62 y.o. female. Vag d/c for 3 days. Thick clumpy and white.  Vulvar irritation.  Was sexually active about 2 weeks ago.  Interested in STI testing.  Discussed waiting for HIV and RPR testing.  Is not sure if she has dysuria, may just be vulvar irritation.  Also complains of left upper extremity numbness and weakness that happens spontaneously and resolve spontaneously.  Happens about once a month.  Can use her arm properly when it happens and whole arm feels numb.  HPI  Past Medical History:  Diagnosis Date   Arthritis    Goiter    Macromastia 08/07/2017   Rash, skin     Patient Active Problem List   Diagnosis Date Noted   GERD (gastroesophageal reflux disease) 01/20/2015   Glucose intolerance (impaired glucose tolerance) 01/20/2015   Osteopenia 01/20/2015   Pelvic pain in female 01/20/2015   Thyroid nodule 01/20/2015   Multinodular goiter 06/04/2012    Past Surgical History:  Procedure Laterality Date   ABDOMINAL HYSTERECTOMY     BREAST REDUCTION SURGERY Bilateral 08/12/2017   Procedure: MAMMARY REDUCTION  (BREAST);  Surgeon: Louisa Second, MD;  Location: [facility];  Service: Plastics;  Laterality: Bilateral;   BREAST SURGERY     cyst (benign)   FINGER AMPUTATION  08/08/11   right   LABIAL ADHESION LYSIS     LIPOSUCTION Bilateral 08/12/2017   Procedure: LIPOSUCTION;  Surgeon: Louisa Second, MD;  Location: [facility];  Service: Plastics;  Laterality: Bilateral;   REDUCTION MAMMAPLASTY Bilateral 2018   TUBAL LIGATION      OB History   No obstetric history on file.      Home Medications    Prior to Admission medications   Medication Sig Start Date End Date Taking? Authorizing Provider  alendronate (FOSAMAX) 70 MG tablet Take  70 mg by mouth once a week. Take with a full glass of water on an empty stomach.    [provider]  atorvastatin (LIPITOR) 10 MG tablet TK 1 T PO QD 02/27/19   [provider]  cyclobenzaprine (FLEXERIL) 10 MG tablet TK 1 T PO  HS 12/24/18   [provider]  desonide (DESOWEN) 0.05 % cream APPLY A THIN LAYER TO AFFECTED AREAS AS DIRECTED 08/04/19   [provider]  DIVIGEL 1 MG/GM GEL TAKE 1 PACKAGE BY MOUTH ONCE DAILY 04/07/18   [provider]  fluconazole (DIFLUCAN) 150 MG tablet Take 1 tablet (150 mg total) by mouth every 3 (three) days. 03/25/23   Cathlyn Parsons, NP  ibuprofen (ADVIL) 800 MG tablet Take 1 tablet (800 mg total) by mouth every 8 (eight) hours as needed. 04/23/19   Vivi Barrack, DPM  zolpidem (AMBIEN) 10 MG tablet Take 10 mg by mouth at bedtime. 09/16/19   [provider]    Family History Family History  Problem Relation Age of Onset   Cancer Mother        ovarian   Kidney disease Father     Social History Social History   Tobacco Use   Smoking status: Never   Smokeless tobacco: Never  Substance Use Topics   Alcohol use: No   Drug use: No     Allergies  Sulfa antibiotics, Codeine, Septra [bactrim], and Tetracyclines & related   Review of Systems Review of Systems   Physical Exam Triage Vital Signs ED Triage Vitals  Enc Vitals Group     BP 03/25/23 1743 117/77     Pulse Rate 03/25/23 1743 85     Resp 03/25/23 1743 17     Temp 03/25/23 1743 98 F (36.7 C)     Temp Source 03/25/23 1743 Oral     SpO2 03/25/23 1743 97 %     Weight --      Height --      Head Circumference --      Peak Flow --      Pain Score 03/25/23 1745 0     Pain Loc --      Pain Edu? --      Excl. in GC? --    No data found.  Updated Vital Signs BP 117/77 (BP Location: Left Arm)   Pulse 85   Temp 98 F (36.7 C) (Oral)   Resp 17   SpO2 97%   Visual Acuity Right Eye Distance:   Left Eye Distance:   Bilateral  Distance:    Right Eye Near:   Left Eye Near:    Bilateral Near:     Physical Exam   UC Treatments / Results  Labs (all labs ordered are listed, but only abnormal results are displayed) Labs Reviewed  POCT URINALYSIS DIP (MANUAL ENTRY) - Abnormal; Notable for the following components:      Result Value   Ketones, POC UA small (15) (*)    Spec Grav, UA >=1.030 (*)    All other components within normal limits  CERVICOVAGINAL ANCILLARY ONLY    EKG   Radiology No results found.  Procedures Procedures (including critical care time)  Medications Ordered in UC Medications - No data to display  Initial Impression / Assessment and Plan / UC Course  I have reviewed the triage vital signs and the nursing notes.  Pertinent labs & imaging results that were available during my care of the patient were reviewed by me and considered in my medical decision making (see chart for details).     *** Final Clinical Impressions(s) / UC Diagnoses   Final diagnoses:  Vaginal discharge  LUE numbness     Discharge Instructions      Follow up with sports medicine about your arm numbness/weakness. Continue keeping track of your episodes   You will get a call if tests are positive, you will not get a call if tests are negative but you can check results in MyChart if you have a MyChart account.      ED Prescriptions     Medication Sig Dispense Auth. Provider   fluconazole (DIFLUCAN) 150 MG tablet Take 1 tablet (150 mg total) by mouth every 3 (three) days. 2 tablet Cathlyn Parsons, NP      PDMP not reviewed this encounter.

## 2023-03-25 NOTE — Discharge Instructions (Addendum)
Follow up with sports medicine about your arm numbness/weakness. Continue keeping track of your episodes   You will get a call if tests are positive, you will not get a call if tests are negative but you can check results in MyChart if you have a MyChart account.

## 2023-03-25 NOTE — ED Triage Notes (Signed)
Pt presents with vaginal itching and thick discharge X 4 days.

## 2023-03-26 LAB — CERVICOVAGINAL ANCILLARY ONLY
Bacterial Vaginitis (gardnerella): NEGATIVE
Candida Glabrata: NEGATIVE
Candida Vaginitis: POSITIVE — AB
Chlamydia: NEGATIVE
Comment: NEGATIVE
Comment: NEGATIVE
Comment: NEGATIVE
Comment: NEGATIVE
Comment: NEGATIVE
Comment: NORMAL
Neisseria Gonorrhea: NEGATIVE
Trichomonas: NEGATIVE

## 2023-03-28 DIAGNOSIS — R232 Flushing: Secondary | ICD-10-CM | POA: Diagnosis not present

## 2023-03-28 DIAGNOSIS — Z79890 Hormone replacement therapy (postmenopausal): Secondary | ICD-10-CM | POA: Diagnosis not present

## 2023-03-28 DIAGNOSIS — R6882 Decreased libido: Secondary | ICD-10-CM | POA: Diagnosis not present

## 2023-03-28 DIAGNOSIS — N951 Menopausal and female climacteric states: Secondary | ICD-10-CM | POA: Diagnosis not present

## 2023-04-04 DIAGNOSIS — M542 Cervicalgia: Secondary | ICD-10-CM | POA: Diagnosis not present

## 2023-04-12 DIAGNOSIS — M542 Cervicalgia: Secondary | ICD-10-CM | POA: Diagnosis not present

## 2023-04-16 DIAGNOSIS — M542 Cervicalgia: Secondary | ICD-10-CM | POA: Diagnosis not present

## 2023-04-22 DIAGNOSIS — R519 Headache, unspecified: Secondary | ICD-10-CM | POA: Diagnosis not present

## 2023-04-23 DIAGNOSIS — H35341 Macular cyst, hole, or pseudohole, right eye: Secondary | ICD-10-CM | POA: Diagnosis not present

## 2023-04-25 DIAGNOSIS — M542 Cervicalgia: Secondary | ICD-10-CM | POA: Diagnosis not present

## 2023-05-21 DIAGNOSIS — H2511 Age-related nuclear cataract, right eye: Secondary | ICD-10-CM | POA: Diagnosis not present

## 2023-05-24 DIAGNOSIS — M5412 Radiculopathy, cervical region: Secondary | ICD-10-CM | POA: Diagnosis not present

## 2023-06-18 DIAGNOSIS — M5412 Radiculopathy, cervical region: Secondary | ICD-10-CM | POA: Diagnosis not present

## 2023-06-20 DIAGNOSIS — H2511 Age-related nuclear cataract, right eye: Secondary | ICD-10-CM | POA: Diagnosis not present

## 2023-06-25 ENCOUNTER — Encounter: Payer: Self-pay | Admitting: Nurse Practitioner

## 2023-06-25 ENCOUNTER — Other Ambulatory Visit: Payer: Self-pay | Admitting: Nurse Practitioner

## 2023-06-25 ENCOUNTER — Ambulatory Visit: Payer: Self-pay

## 2023-06-25 DIAGNOSIS — M79672 Pain in left foot: Secondary | ICD-10-CM

## 2023-06-25 DIAGNOSIS — M25572 Pain in left ankle and joints of left foot: Secondary | ICD-10-CM

## 2023-07-29 DIAGNOSIS — B3731 Acute candidiasis of vulva and vagina: Secondary | ICD-10-CM | POA: Diagnosis not present

## 2023-08-23 ENCOUNTER — Ambulatory Visit: Payer: Self-pay | Admitting: Surgery

## 2023-08-23 NOTE — H&P (Signed)
REFERRING PHYSICIAN: Lelan Pons, MD  PROVIDER: Kristyna Bradstreet [name], MD   Chief Complaint: New Consultation ( Multinodular goiter with compressive symptoms)  History of Present Illness:  Patient is referred by Dr. Dorisann Bell for surgical evaluation and recommendations regarding multinodular thyroid goiter with compressive symptoms. Patient has had a longstanding multinodular goiter. This has gradually increased in size. Patient has noted a globus sensation. She complains of neck discomfort. She has developed episodes of solid food dysphagia. Recent ultrasound examination shows an enlarged thyroid gland with a right lobe measuring 7.6 cm and left lobe measuring 7.1 cm. Right function is normal with a recent TSH Bell of 0.706. Patient has never been on thyroid medication. She has had no prior head or neck surgery. There is a family history of thyroid disease in the patient's mother who underwent thyroid surgery and takes thyroid medication. There is no family history of thyroid malignancy. 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  Review of Systems: A complete review of systems was obtained from the patient. I have reviewed this information and discussed as appropriate with the patient. See HPI as well for other ROS.  Review of Systems  Constitutional: Negative.  HENT: Positive for sore throat.  Eyes: Negative.  Respiratory: Positive for shortness of breath.  Cardiovascular: Negative.  Gastrointestinal:  Dysphagia, globus sensation  Genitourinary: Negative.  Musculoskeletal: Negative.  Skin: Negative.  Neurological: Negative.  Endo/Heme/Allergies: Negative.  Psychiatric/Behavioral: Negative.    Medical History: Past Medical History:  Diagnosis Date  Thyroid disease   Patient Active Problem List  Diagnosis  Multiple thyroid nodules  Enlarged thyroid gland   History reviewed. No  pertinent surgical history.   Allergies  Allergen Reactions  Sulfa (Sulfonamide Antibiotics) Nausea  Codeine Diarrhea and Other (See Comments)  GI UPSET  Sulfamethoxazole-Trimethoprim Diarrhea and Other (See Comments)  GI UPSET  Tetracycline Diarrhea and Other (See Comments)  GI UPSET   No current outpatient medications on file prior to visit.   No current facility-administered medications on file prior to visit.   Family History  Problem Relation Age of Onset  Breast cancer Mother  High blood pressure (Hypertension) Sister    Social History   Tobacco Use  Smoking Status Never  Smokeless Tobacco Never    Social History   Socioeconomic History  Marital status: Unknown  Tobacco Use  Smoking status: Never  Smokeless tobacco: Never  Substance and Sexual Activity  Alcohol use: Not Currently  Drug use: Never   Objective:   Vitals:  BP: 124/77  Pulse: 109  Temp: 36.1 C (97 F)  SpO2: 98%  Weight: 81.6 kg (180 lb)  Height: 157.5 cm (5\' 2" )   Body mass index is 32.92 kg/m.  Physical Exam   GENERAL APPEARANCE Comfortable, no acute issues Development: normal Gross deformities: none  SKIN Rash, lesions, ulcers: none Induration, erythema: none Nodules: none palpable  EYES Conjunctiva and lids: normal Pupils: equal and reactive  EARS, NOSE, MOUTH, THROAT External ears: no lesion or deformity External nose: no lesion or deformity Hearing: grossly normal  NECK Symmetric: no Trachea: midline Thyroid: Enlarged thyroid gland, relatively soft, asymmetric, with the right lobe being more prominent anteriorly and the left lobe extending more posteriorly and inferiorly. There is no significant tenderness. There is no associated lymphadenopathy. Gland is mobile with swallowing.  CHEST Respiratory effort: normal Retraction or accessory muscle use: no Breath sounds: normal bilaterally  Rales, rhonchi, wheeze: none  CARDIOVASCULAR Auscultation: regular  rhythm, normal rate Murmurs: none Pulses: radial pulse 2+ palpable Lower extremity edema: none  ABDOMEN Not assessed  GENITOURINARY/RECTAL Not assessed  MUSCULOSKELETAL Station and gait: normal Digits and nails: no clubbing or cyanosis Muscle strength: grossly normal all extremities Range of motion: grossly normal all extremities Deformity: none  LYMPHATIC Cervical: none palpable Supraclavicular: none palpable  PSYCHIATRIC Oriented to person, place, and time: yes Mood and affect: normal for situation Judgment and insight: appropriate for situation  Assessment and Plan:  Multiple thyroid nodules Enlarged thyroid gland  Patient is referred by her endocrinologist for surgical evaluation and recommendations regarding management of multinodular thyroid goiter with compressive symptoms.  Patient provided with a copy of "The Thyroid Book: Medical and Surgical Treatment of Thyroid Problems", published by [employer], 16 pages. Book reviewed and explained to patient during visit today.  Today we reviewed her clinical history, her laboratory studies, and her imaging studies including her most recent ultrasound examination. Patient has a distant history of thyroid biopsy in 2015 and 2017, both of which were benign. Patient has developed progressive compressive symptoms including dysphagia, globus sensation, neck discomfort, and nocturnal dyspnea. Today we discussed proceeding with total thyroidectomy. We discussed the risk and benefits of the procedure. We discussed the risk of recurrent laryngeal nerve injury and injury to parathyroid glands. We discussed the size and location of the surgical incision. We discussed the hospital stay to be anticipated. We discussed her postoperative recovery and time out of work. We discussed the need for lifelong thyroid hormone replacement therapy. The patient understands and would like to consider this at home for a few days. She would like to look at her work  schedule. She will notify us when she would like to proceed with thyroidectomy.   Lisa Level, MD [facility] practice Office: [phone]

## 2023-10-05 ENCOUNTER — Encounter: Payer: Self-pay | Admitting: *Deleted

## 2023-10-05 ENCOUNTER — Ambulatory Visit
Admission: EM | Admit: 2023-10-05 | Discharge: 2023-10-05 | Disposition: A | Discharge status: Home / Self Care | Payer: BC Managed Care – PPO | Attending: Internal Medicine | Admitting: Internal Medicine

## 2023-10-05 ENCOUNTER — Other Ambulatory Visit: Payer: Self-pay

## 2023-10-05 DIAGNOSIS — Z113 Encounter for screening for infections with a predominantly sexual mode of transmission: Secondary | ICD-10-CM | POA: Insufficient documentation

## 2023-10-05 DIAGNOSIS — R053 Chronic cough: Secondary | ICD-10-CM | POA: Insufficient documentation

## 2023-10-05 DIAGNOSIS — N898 Other specified noninflammatory disorders of vagina: Secondary | ICD-10-CM | POA: Diagnosis not present

## 2023-10-05 MED ORDER — BENZONATATE 100 MG PO CAPS
100.0000 mg | ORAL_CAPSULE | Freq: Three times a day (TID) | ORAL | 0 refills | Status: DC | PRN
Start: 2023-10-05 — End: 2024-01-14

## 2023-10-05 MED ORDER — PREDNISONE 20 MG PO TABS: 20.0000 mg | ORAL_TABLET | Freq: Every day | ORAL | 0 refills | Status: AC

## 2023-10-05 MED ORDER — AZITHROMYCIN 250 MG PO TABS: ORAL_TABLET | ORAL | 0 refills | Status: AC

## 2023-10-05 NOTE — ED Triage Notes (Signed)
Cough and sneezing with "throat itching" x 1 week. Using OTC meds with minimal relief. Also reports vaginal discharge and itching since Monday. Tried monistat one without relief. Denies urinary sx

## 2023-10-05 NOTE — ED Provider Notes (Signed)
[facility]    CSN: [id number] Arrival date & time: 10/05/23  4098      History   Chief Complaint Chief Complaint  Patient presents with   Vaginal Discharge   Cough    HPI Lisa Bell is a 62 y.o. female.   Patient presents with multiple different chief complaints today.  Patient reports that she has had coughing, nasal congestion, sneezing, itchy throat for approximately 7 to 8 days.  Patient not reporting any chest pain or shortness of breath.  Cough is productive at times.  Denies any fever or known sick contacts.  Patient has been taking TheraFlu and Alka-Seltzer cold and flu over-the-counter with minimal improvement in symptoms.  Patient denies formal diagnosis of asthma or COPD and denies that she smokes cigarettes.  Patient also reporting vaginal discharge and itching that started a few days prior.  Reports that she was using Monistat with minimal improvement.  Patient is not reporting any urinary symptoms, abdominal pain, fever.  Patient denies exposure to STD but does have unprotected intercourse with a single sexual partner.  She would like STD testing today.   Vaginal Discharge Cough   Past Medical History:  Diagnosis Date   Arthritis    Goiter    Macromastia 08/07/2017   Rash, skin     Patient Active Problem List   Diagnosis Date Noted   GERD (gastroesophageal reflux disease) 01/20/2015   Glucose intolerance (impaired glucose tolerance) 01/20/2015   Osteopenia 01/20/2015   Pelvic pain in female 01/20/2015   Thyroid nodule 01/20/2015   Multinodular goiter 06/04/2012    Past Surgical History:  Procedure Laterality Date   ABDOMINAL HYSTERECTOMY     BREAST REDUCTION SURGERY Bilateral 08/12/2017   Procedure: MAMMARY REDUCTION  (BREAST);  Surgeon: Louisa Second, MD;  Location: [facility];  Service: Plastics;  Laterality: Bilateral;   BREAST SURGERY     cyst (benign)   FINGER AMPUTATION  08/08/11   right   LABIAL ADHESION  LYSIS     LIPOSUCTION Bilateral 08/12/2017   Procedure: LIPOSUCTION;  Surgeon: Louisa Second, MD;  Location: [facility];  Service: Plastics;  Laterality: Bilateral;   REDUCTION MAMMAPLASTY Bilateral 2018   TUBAL LIGATION      OB History   No obstetric history on file.      Home Medications    Prior to Admission medications   Medication Sig Start Date End Date Taking? Authorizing Provider  azithromycin (ZITHROMAX Z-PAK) 250 MG tablet Take 2 tablets (500 mg total) by mouth daily for 1 day, THEN 1 tablet (250 mg total) daily for 4 days. 10/05/23 10/10/23 Yes Angelita Harnack, Acie Fredrickson, FNP  benzonatate (TESSALON) 100 MG capsule Take 1 capsule (100 mg total) by mouth every 8 (eight) hours as needed for cough. 10/05/23  Yes Gargi Berch, Rolly Salter E, FNP  cyclobenzaprine (FLEXERIL) 10 MG tablet TK 1 T PO  HS 12/24/18  Yes [provider]  desonide (DESOWEN) 0.05 % cream APPLY A THIN LAYER TO AFFECTED AREAS AS DIRECTED 08/04/19  Yes [provider]  DIVIGEL 1 MG/GM GEL TAKE 1 PACKAGE BY MOUTH ONCE DAILY 04/07/18  Yes [provider]  predniSONE (DELTASONE) 20 MG tablet Take 1 tablet (20 mg total) by mouth daily for 5 days. 10/05/23 10/10/23 Yes Alanii Ramer, Acie Fredrickson, FNP  zolpidem (AMBIEN) 10 MG tablet Take 10 mg by mouth at bedtime. 09/16/19  Yes [provider]  alendronate (FOSAMAX) 70 MG tablet Take 70 mg by mouth once a  week. Take with a full glass of water on an empty stomach.    [provider]  atorvastatin (LIPITOR) 10 MG tablet TK 1 T PO QD 02/27/19   [provider]  fluconazole (DIFLUCAN) 150 MG tablet Take 1 tablet (150 mg total) by mouth every 3 (three) days. 03/25/23   Cathlyn Parsons, NP  ibuprofen (ADVIL) 800 MG tablet Take 1 tablet (800 mg total) by mouth every 8 (eight) hours as needed. 04/23/19   Vivi Barrack, DPM    Family History Family History  Problem Relation Age of Onset   Cancer Mother        ovarian   Kidney disease Father      Social History Social History   Tobacco Use   Smoking status: Never   Smokeless tobacco: Never  Vaping Use   Vaping status: Never Used  Substance Use Topics   Alcohol use: No   Drug use: No     Allergies   Sulfa antibiotics, Codeine, Septra [bactrim], and Tetracyclines & related   Review of Systems Review of Systems Per HPI  Physical Exam Triage Vital Signs ED Triage Vitals  Encounter Vitals Group     BP 10/05/23 1015 118/78     Systolic BP Percentile --      Diastolic BP Percentile --      Pulse Rate 10/05/23 1015 94     Resp 10/05/23 1015 16     Temp 10/05/23 1015 98 F (36.7 C)     Temp Source 10/05/23 1015 Oral     SpO2 10/05/23 1015 98 %     Weight --      Height --      Head Circumference --      Peak Flow --      Pain Score 10/05/23 1010 8     Pain Loc --      Pain Education --      Exclude from Growth Chart --    No data found.  Updated Vital Signs BP 118/78 (BP Location: Left Arm)   Pulse 94   Temp 98 F (36.7 C) (Oral)   Resp 16   SpO2 98%   Visual Acuity Right Eye Distance:   Left Eye Distance:   Bilateral Distance:    Right Eye Near:   Left Eye Near:    Bilateral Near:     Physical Exam Constitutional:      General: She is not in acute distress.    Appearance: Normal appearance. She is not toxic-appearing or diaphoretic.  HENT:     Head: Normocephalic and atraumatic.     Right Ear: Ear canal normal. No drainage, swelling or tenderness. A middle ear effusion is present. Tympanic membrane is not perforated, erythematous or bulging.     Left Ear: Ear canal normal. No drainage, swelling or tenderness. A middle ear effusion is present. Tympanic membrane is not perforated, erythematous or bulging.     Nose: Congestion present.     Mouth/Throat:     Mouth: Mucous membranes are moist.     Pharynx: No posterior oropharyngeal erythema.  Eyes:     Extraocular Movements: Extraocular movements intact.     Conjunctiva/sclera:  Conjunctivae normal.     Pupils: Pupils are equal, round, and reactive to light.  Cardiovascular:     Rate and Rhythm: Normal rate and regular rhythm.     Pulses: Normal pulses.     Heart sounds: Normal heart sounds.  Pulmonary:  Effort: Pulmonary effort is normal. No respiratory distress.     Breath sounds: Normal breath sounds. No stridor. No wheezing, rhonchi or rales.  Abdominal:     General: Abdomen is flat. Bowel sounds are normal.     Palpations: Abdomen is soft.  Genitourinary:    Comments: Deferred with shared decision making.  Self swab performed. Musculoskeletal:        General: Normal range of motion.     Cervical back: Normal range of motion.  Skin:    General: Skin is warm and dry.  Neurological:     General: No focal deficit present.     Mental Status: She is alert and oriented to person, place, and time. Mental status is at baseline.  Psychiatric:        Mood and Affect: Mood normal.        Behavior: Behavior normal.      UC Treatments / Results  Labs (all labs ordered are listed, but only abnormal results are displayed) Labs Reviewed  HIV ANTIBODY (ROUTINE TESTING W REFLEX)  RPR  CERVICOVAGINAL ANCILLARY ONLY    EKG   Radiology No results found.  Procedures Procedures (including critical care time)  Medications Ordered in UC Medications - No data to display  Initial Impression / Assessment and Plan / UC Course  I have reviewed the triage vital signs and the nursing notes.  Pertinent labs & imaging results that were available during my care of the patient were reviewed by me and considered in my medical decision making (see chart for details).     1.  Persistent cough  Suspect symptoms started off as a viral illness versus allergic rhinitis.  Suspect possible postviral cough versus acute bronchitis at this time.  There are no adventitious lung sounds on exam, no tachypnea, and oxygen is normal so do not think that chest imaging is  necessary.  Will treat with azithromycin to cover for atypicals, prednisone to decrease inflammation in chest and help with harsh coughing, and benzonatate for cough.  Advised to follow-up if any symptoms persist or worsen.  2.  Vaginal discharge  Cervicovaginal swab pending.  Given no confirmed exposure to STD, topical medications not being helpful, and patient requesting STD testing given concerns, will await swab prior to any treatment today.  Patient also requesting HIV and syphilis testing so this is pending.  Advised follow-up precautions for this as well.  Patient verbalized understanding and was agreeable with plan. Final Clinical Impressions(s) / UC Diagnoses   Final diagnoses:  Persistent cough  Vaginal discharge  Screening examination for venereal disease     Discharge Instructions      I have prescribed an antibiotic, cough medication, steroid to help alleviate persistent cough and your respiratory symptoms.  Your vaginal swab and blood work are pending.  Will call if they are positive and send any appropriate treatment.    ED Prescriptions     Medication Sig Dispense Auth. Provider   azithromycin (ZITHROMAX Z-PAK) 250 MG tablet Take 2 tablets (500 mg total) by mouth daily for 1 day, THEN 1 tablet (250 mg total) daily for 4 days. 6 tablet [place], [place], [place]   benzonatate (TESSALON) 100 MG capsule Take 1 capsule (100 mg total) by mouth every 8 (eight) hours as needed for cough. 21 capsule [place], [place], [place]   predniSONE (DELTASONE) 20 MG tablet Take 1 tablet (20 mg total) by mouth daily for 5 days. 5 tablet [place], Acie Fredrickson, [place]  PDMP not reviewed this encounter.   Gustavus Bryant, [place] 10/05/23 1113

## 2023-10-05 NOTE — Discharge Instructions (Signed)
I have prescribed an antibiotic, cough medication, steroid to help alleviate persistent cough and your respiratory symptoms.  Your vaginal swab and blood work are pending.  Will call if they are positive and send any appropriate treatment.

## 2023-10-07 LAB — CERVICOVAGINAL ANCILLARY ONLY
Bacterial Vaginitis (gardnerella): NEGATIVE
Candida Glabrata: NEGATIVE
Candida Vaginitis: POSITIVE — AB
Chlamydia: NEGATIVE
Comment: NEGATIVE
Comment: NEGATIVE
Comment: NEGATIVE
Comment: NEGATIVE
Comment: NEGATIVE
Comment: NORMAL
Neisseria Gonorrhea: NEGATIVE
Trichomonas: NEGATIVE

## 2023-10-08 ENCOUNTER — Telehealth: Payer: Self-pay

## 2023-10-08 LAB — HIV ANTIBODY (ROUTINE TESTING W REFLEX): HIV Screen 4th Generation wRfx: NONREACTIVE

## 2023-10-08 LAB — RPR: RPR Ser Ql: NONREACTIVE

## 2023-10-08 MED ORDER — FLUCONAZOLE 150 MG PO TABS: 150.0000 mg | ORAL_TABLET | Freq: Once | ORAL | 0 refills | Status: AC

## 2023-10-08 NOTE — Telephone Encounter (Signed)
 Per protocol, pt requires tx with Diflucan. Reviewed with patient, verified pharmacy, prescription sent.

## 2023-11-12 DIAGNOSIS — H16142 Punctate keratitis, left eye: Secondary | ICD-10-CM | POA: Diagnosis not present

## 2023-11-12 DIAGNOSIS — T1512XA Foreign body in conjunctival sac, left eye, initial encounter: Secondary | ICD-10-CM | POA: Diagnosis not present

## 2023-12-24 ENCOUNTER — Other Ambulatory Visit: Payer: Self-pay | Admitting: Obstetrics and Gynecology

## 2023-12-24 DIAGNOSIS — Z1231 Encounter for screening mammogram for malignant neoplasm of breast: Secondary | ICD-10-CM

## 2024-01-02 ENCOUNTER — Ambulatory Visit: Payer: BC Managed Care – PPO | Admitting: Podiatry

## 2024-01-02 DIAGNOSIS — R7303 Prediabetes: Secondary | ICD-10-CM | POA: Diagnosis not present

## 2024-01-02 DIAGNOSIS — R300 Dysuria: Secondary | ICD-10-CM | POA: Diagnosis not present

## 2024-01-02 DIAGNOSIS — B3731 Acute candidiasis of vulva and vagina: Secondary | ICD-10-CM | POA: Diagnosis not present

## 2024-01-02 DIAGNOSIS — M778 Other enthesopathies, not elsewhere classified: Secondary | ICD-10-CM

## 2024-01-03 DIAGNOSIS — R102 Pelvic and perineal pain: Secondary | ICD-10-CM | POA: Diagnosis not present

## 2024-01-14 ENCOUNTER — Encounter: Payer: Self-pay | Admitting: Podiatry

## 2024-01-14 ENCOUNTER — Ambulatory Visit (INDEPENDENT_AMBULATORY_CARE_PROVIDER_SITE_OTHER): Payer: BC Managed Care – PPO

## 2024-01-14 ENCOUNTER — Ambulatory Visit: Payer: BC Managed Care – PPO | Admitting: Podiatry

## 2024-01-14 DIAGNOSIS — M2012 Hallux valgus (acquired), left foot: Secondary | ICD-10-CM | POA: Diagnosis not present

## 2024-01-14 DIAGNOSIS — G5792 Unspecified mononeuropathy of left lower limb: Secondary | ICD-10-CM | POA: Diagnosis not present

## 2024-01-14 DIAGNOSIS — G4700 Insomnia, unspecified: Secondary | ICD-10-CM | POA: Insufficient documentation

## 2024-01-14 DIAGNOSIS — M778 Other enthesopathies, not elsewhere classified: Secondary | ICD-10-CM

## 2024-01-14 DIAGNOSIS — M7752 Other enthesopathy of left foot: Secondary | ICD-10-CM | POA: Diagnosis not present

## 2024-01-14 DIAGNOSIS — R141 Gas pain: Secondary | ICD-10-CM | POA: Insufficient documentation

## 2024-01-14 MED ORDER — DEXAMETHASONE SODIUM PHOSPHATE 120 MG/30ML IJ SOLN
2.0000 mg | Freq: Once | INTRAMUSCULAR | Status: AC
  Administered 2024-01-14: 2 mg via INTRA_ARTICULAR

## 2024-01-14 MED ORDER — TRIAMCINOLONE ACETONIDE 40 MG/ML IJ SUSP
20.0000 mg | Freq: Once | INTRAMUSCULAR | Status: AC
  Administered 2024-01-14: 20 mg

## 2024-01-15 NOTE — Progress Notes (Signed)
Subjective:  Patient ID: Lisa Bell, female    DOB: 05/25/1961,  MRN: [id number] HPI Chief Complaint  Patient presents with   Foot Pain    1st MPJ left - bunion deformity x years, sharp, shooting pains intermittently, but happening more frequently now, shoes sometimes get uncomfortable, did twist foot at work last year   New Patient (Initial Visit)    Est pt 2022    63 y.o. female presents with the above complaint.   ROS: Denies fever chills nausea vomit muscle aches pains calf pain back pain chest pain shortness of breath.  Past Medical History:  Diagnosis Date   Arthritis    Goiter    Macromastia 08/07/2017   Rash, skin    Past Surgical History:  Procedure Laterality Date   ABDOMINAL HYSTERECTOMY     BREAST REDUCTION SURGERY Bilateral 08/12/2017   Procedure: MAMMARY REDUCTION  (BREAST);  Surgeon: Louisa Second, MD;  Location: [facility];  Service: Plastics;  Laterality: Bilateral;   BREAST SURGERY     cyst (benign)   FINGER AMPUTATION  08/08/11   right   LABIAL ADHESION LYSIS     LIPOSUCTION Bilateral 08/12/2017   Procedure: LIPOSUCTION;  Surgeon: Louisa Second, MD;  Location: [facility];  Service: Plastics;  Laterality: Bilateral;   REDUCTION MAMMAPLASTY Bilateral 2018   TUBAL LIGATION      Current Outpatient Medications:    atorvastatin (LIPITOR) 10 MG tablet, TK 1 T PO QD, Disp: , Rfl:    cyclobenzaprine (FLEXERIL) 10 MG tablet, TK 1 T PO  HS, Disp: , Rfl:    desonide (DESOWEN) 0.05 % cream, APPLY A THIN LAYER TO AFFECTED AREAS AS DIRECTED, Disp: , Rfl:    DIVIGEL 1 MG/GM GEL, TAKE 1 PACKAGE BY MOUTH ONCE DAILY, Disp: , Rfl: 4   fluconazole (DIFLUCAN) 150 MG tablet, Take 1 tablet (150 mg total) by mouth every 3 (three) days., Disp: 2 tablet, Rfl: 0   ibuprofen (ADVIL) 800 MG tablet, Take 1 tablet (800 mg total) by mouth every 8 (eight) hours as needed., Disp: 30 tablet, Rfl: 0   loratadine (CLARITIN) 10 MG tablet, Take 10 mg  by mouth daily., Disp: , Rfl:    metFORMIN (GLUCOPHAGE) 500 MG tablet, Take 500 mg by mouth daily., Disp: , Rfl:    methocarbamol (ROBAXIN) 750 MG tablet, Take by mouth., Disp: , Rfl:    terconazole (TERAZOL 7) 0.4 % vaginal cream, Place 1 applicator vaginally at bedtime., Disp: , Rfl:    zolpidem (AMBIEN) 10 MG tablet, Take 10 mg by mouth at bedtime., Disp: , Rfl:   Allergies  Allergen Reactions   Sulfa Antibiotics    Codeine Other (See Comments)    GI UPSET   Septra [Bactrim] Other (See Comments)    GI UPSET   Sulfamethoxazole-Trimethoprim Diarrhea    Other Reaction(s): Other (See Comments), Unknown  GI UPSET   Tetracyclines & Related Other (See Comments)    GI UPSET   Review of Systems Objective:  There were no vitals filed for this visit.  General: Well developed, nourished, in no acute distress, alert and oriented x3   Dermatological: Skin is warm, dry and supple bilateral. Nails x 10 are well maintained; remaining integument appears unremarkable at this time. There are no open sores, no preulcerative lesions, no rash or signs of infection present.  Vascular: Dorsalis Pedis artery and Posterior Tibial artery pedal pulses are 2/4 bilateral with immedate capillary fill time. Pedal hair growth present. No  varicosities and no lower extremity edema present bilateral.   Neruologic: Grossly intact via light touch bilateral. Vibratory intact via tuning fork bilateral. Protective threshold with Semmes Wienstein monofilament intact to all pedal sites bilateral. Patellar and Achilles deep tendon reflexes 2+ bilateral. No Babinski or clonus noted bilateral.   Musculoskeletal: No gross boney pedal deformities bilateral. No pain, crepitus, or limitation noted with foot and ankle range of motion bilateral. Muscular strength 5/5 in all groups tested bilateral.  Hallux abductovalgus deformity of the left foot with pain on palpation to the first tender metatarsal space distally as well as along  the medial hypertrophic condyle  Gait: Unassisted, Nonantalgic.    Radiographs:  Radiographs taken today demonstrate osseously mature individual generalized and mineralization of the bone and increase in the first and metatarsal angle hypertrophic medial condyle increase in the hallux abductus angle greater than normal value.  There is some small spurring dorsally and laterally.  Assessment & Plan:   Assessment: Capsulitis hallux abductovalgus deformity left foot.  Plan: Discussed in great detail today surgical intervention.  She will have to talk to work about being off for such a period.  I think that it is better at this point to give her an injection which we did today provided her an injection for neuritis to the intermetatarsal space and for capsulitis the medial aspect.  Dexamethasone was used medially 2 mg and 10 mg to the intermetatarsal space first left.  Follow-up with her as needed.   Warden Buffa T. Uncertain, [place]

## 2024-01-20 DIAGNOSIS — Z1231 Encounter for screening mammogram for malignant neoplasm of breast: Secondary | ICD-10-CM

## 2024-01-28 ENCOUNTER — Ambulatory Visit
Admission: RE | Admit: 2024-01-28 | Discharge: 2024-01-28 | Disposition: A | Discharge status: Home / Self Care | Payer: BC Managed Care – PPO | Source: Ambulatory Visit | Attending: Obstetrics and Gynecology | Admitting: Obstetrics and Gynecology

## 2024-01-28 DIAGNOSIS — Z1231 Encounter for screening mammogram for malignant neoplasm of breast: Secondary | ICD-10-CM | POA: Diagnosis not present

## 2024-02-03 ENCOUNTER — Other Ambulatory Visit: Payer: Self-pay | Admitting: Obstetrics and Gynecology

## 2024-02-03 DIAGNOSIS — R928 Other abnormal and inconclusive findings on diagnostic imaging of breast: Secondary | ICD-10-CM

## 2024-02-12 ENCOUNTER — Ambulatory Visit

## 2024-02-12 ENCOUNTER — Ambulatory Visit
Admission: RE | Admit: 2024-02-12 | Discharge: 2024-02-12 | Disposition: A | Discharge status: Home / Self Care | Source: Ambulatory Visit | Attending: Obstetrics and Gynecology | Admitting: Obstetrics and Gynecology

## 2024-02-12 DIAGNOSIS — R928 Other abnormal and inconclusive findings on diagnostic imaging of breast: Secondary | ICD-10-CM | POA: Diagnosis not present

## 2024-02-13 ENCOUNTER — Ambulatory Visit: Payer: BC Managed Care – PPO | Admitting: Podiatry

## 2024-02-25 ENCOUNTER — Encounter: Payer: Self-pay | Admitting: Gastroenterology

## 2024-02-27 ENCOUNTER — Ambulatory Visit: Admitting: Podiatry

## 2024-02-27 ENCOUNTER — Encounter: Payer: Self-pay | Admitting: Podiatry

## 2024-02-27 DIAGNOSIS — M7752 Other enthesopathy of left foot: Secondary | ICD-10-CM | POA: Diagnosis not present

## 2024-02-27 DIAGNOSIS — L600 Ingrowing nail: Secondary | ICD-10-CM

## 2024-02-27 MED ORDER — DEXAMETHASONE SODIUM PHOSPHATE 120 MG/30ML IJ SOLN
2.0000 mg | Freq: Once | INTRAMUSCULAR | Status: AC
  Administered 2024-02-27: 2 mg via INTRA_ARTICULAR

## 2024-02-27 MED ORDER — NEOMYCIN-POLYMYXIN-HC 1 % OT SOLN: OTIC | 1 refills | Status: DC

## 2024-02-27 NOTE — Patient Instructions (Signed)

## 2024-03-01 NOTE — Progress Notes (Signed)
 She presents today for follow-up of her neuritis around the first metatarsophalangeal joint left foot.  She is also complaining of painful toenails and she would like to get some new orthotics as well.  Objective: Also stable oriented x 3.  Pulses are palpable.  The neuritis to the medial aspect of the first metatarsophalangeal joint is doing much better.  She is sharp and rated nail margins with no signs of infection.  Pes planovalgus bilateral.  Hallux valgus with overlying bursitis right.  Assessment: Ingrown toenail neuritis medial dorsal cutaneous nerve and hallux valgus pes planus.  Plan: Chemical matricectomy was performed today.  I injected the medial aspect of the first metatarsophalangeal joint left.  I also set her up for orthotics to be made.  She was given both oral written home-going instruction for the care and soaking of the toe today as well as a prescription of Cortisporin Otic.  I will follow-up with her in a couple weeks to reevaluate.  I also injected around the first metatarsal phalangeal joint medially with dexamethasone.

## 2024-03-02 ENCOUNTER — Telehealth: Payer: Self-pay | Admitting: Podiatry

## 2024-03-02 NOTE — Telephone Encounter (Signed)
 Patient called stating she had an ingrown done last Thursday. Patient is wondering is it normal for her to be in so much pain from the nail removal. Patient would like a call back from the nurse.

## 2024-03-12 ENCOUNTER — Ambulatory Visit (INDEPENDENT_AMBULATORY_CARE_PROVIDER_SITE_OTHER): Admitting: Podiatry

## 2024-03-12 ENCOUNTER — Encounter: Payer: Self-pay | Admitting: Podiatry

## 2024-03-12 DIAGNOSIS — L600 Ingrowing nail: Secondary | ICD-10-CM

## 2024-03-12 DIAGNOSIS — Z9889 Other specified postprocedural states: Secondary | ICD-10-CM

## 2024-03-12 NOTE — Progress Notes (Signed)
 She presents today for follow-up of her matrixectomy tibial border hallux left.  She states that is doing fine is a little tender that.  Objective: Vital signs are stable she is alert oriented x 3 there is no erythema edema cellulitis drainage or odor there is a scab that is present along the tibial border.  Assessment: Well-healing surgical foot.  Plan: She will continue to soak at least every other day Epsom salts and warm water apply Cortisporin Otic daily cover during the day but leave open at bedtime.  She will also be scheduled for orthotics.

## 2024-03-17 ENCOUNTER — Ambulatory Visit (AMBULATORY_SURGERY_CENTER)

## 2024-03-17 VITALS — Ht 62.0 in | Wt 165.0 lb

## 2024-03-17 DIAGNOSIS — Z1211 Encounter for screening for malignant neoplasm of colon: Secondary | ICD-10-CM

## 2024-03-17 MED ORDER — SUTAB 1479-225-188 MG PO TABS: 12.0000 | ORAL_TABLET | ORAL | 0 refills | Status: DC

## 2024-03-17 MED ORDER — NA SULFATE-K SULFATE-MG SULF 17.5-3.13-1.6 GM/177ML PO SOLN: 1.0000 | Freq: Once | ORAL | 0 refills | Status: DC

## 2024-03-17 NOTE — Progress Notes (Signed)
 No egg or soy allergy known to patient  No issues known to pt with past sedation with any surgeries or procedures Patient denies ever being told they had issues or difficulty with intubation  No FH of Malignant Hyperthermia Pt is not on diet pills Pt is not on  home 02  Pt is not on blood thinners  Pt denies issues with constipation  No A fib or A flutter Have any cardiac testing pending-- no  LOA: independent  Prep: sutab  Patient's chart reviewed by Cathlyn Parsons CNRA prior to previsit and patient appropriate for the [facility].  Previsit completed and red dot placed by patient's name on their procedure day (on provider's schedule).     PV completed with patient. Prep instructions sent via mychart and home address.

## 2024-03-30 ENCOUNTER — Encounter: Payer: Self-pay | Admitting: Gastroenterology

## 2024-04-07 ENCOUNTER — Ambulatory Visit (AMBULATORY_SURGERY_CENTER): Admitting: Gastroenterology

## 2024-04-07 ENCOUNTER — Encounter: Payer: Self-pay | Admitting: Gastroenterology

## 2024-04-07 VITALS — BP 84/59 | HR 72 | Temp 97.9°F | Resp 14 | Ht 62.0 in | Wt 165.0 lb

## 2024-04-07 DIAGNOSIS — Z1211 Encounter for screening for malignant neoplasm of colon: Secondary | ICD-10-CM | POA: Diagnosis not present

## 2024-04-07 DIAGNOSIS — D122 Benign neoplasm of ascending colon: Secondary | ICD-10-CM | POA: Diagnosis not present

## 2024-04-07 MED ORDER — SODIUM CHLORIDE 0.9 % IV SOLN: 500.0000 mL | INTRAVENOUS | Status: DC

## 2024-04-07 NOTE — Progress Notes (Signed)
 Upon arrival in recovery, patient's glucose was 70.  2- 4 oz orange juice was given. Denies any signs or symptoms  Rechecked glucose at 0958- 65- Denies any signs or symptoms.   Patient's glucose was 74 @ 10:12am. Pt denies any signs or symptoms. Alert and oriented X4. RN informed patient to eat upon discharge. Patient verbalizes understanding.

## 2024-04-07 NOTE — Progress Notes (Signed)
 Pt's states no medical or surgical changes since previsit or office visit.

## 2024-04-07 NOTE — Progress Notes (Signed)
 A/o x 3, VSS, gd SR's, pleased with anesthesia, report to RN

## 2024-04-07 NOTE — Progress Notes (Signed)
 Called to room to assist during endoscopic procedure.  Patient ID and intended procedure confirmed with present staff. Received instructions for my participation in the procedure from the performing physician.

## 2024-04-07 NOTE — Progress Notes (Signed)
 [facility] History and Physical   Primary Care Physician:  Ivery Marking, MD   Reason for Procedure:   Colon cancer screening  Plan:    Screening colonoscopy     HPI: Lisa Bell is a 63 y.o. female undergoing average risk screening colonoscopy.  She has no family history of colon cancer and no chronic GI symptoms other than chronic abdominal pain.  She reports having a normal colonoscopy 10 years ago with [facility].   Past Medical History:  Diagnosis Date   Arthritis    Goiter    Macromastia 08/07/2017   Rash, skin     Past Surgical History:  Procedure Laterality Date   ABDOMINAL HYSTERECTOMY     BREAST REDUCTION SURGERY Bilateral 08/12/2017   Procedure: MAMMARY REDUCTION  (BREAST);  Surgeon: Phyllis Breeze, MD;  Location: [facility];  Service: Plastics;  Laterality: Bilateral;   BREAST SURGERY     cyst (benign)   FINGER AMPUTATION  08/08/11   right   LABIAL ADHESION LYSIS     LIPOSUCTION Bilateral 08/12/2017   Procedure: LIPOSUCTION;  Surgeon: Phyllis Breeze, MD;  Location: [facility];  Service: Plastics;  Laterality: Bilateral;   REDUCTION MAMMAPLASTY Bilateral 2018   TUBAL LIGATION      Prior to Admission medications   Medication Sig Start Date End Date Taking? Authorizing Provider  Calcium Carb-Cholecalciferol (CALCIUM 600+D3) 600-20 MG-MCG TABS Take 1 tablet by mouth daily.   Yes [provider]  Cholecalciferol (VITAMIN D3) 125 MCG (5000 UT) CAPS Take 1 capsule by mouth daily.   Yes [provider]  ibuprofen  (ADVIL ) 800 MG tablet Take 1 tablet (800 mg total) by mouth every 8 (eight) hours as needed. 04/23/19  Yes Charity Conch, DPM  atorvastatin (LIPITOR) 10 MG tablet TK 1 T PO QD Patient not taking: Reported on 04/07/2024 02/27/19   [provider]  cyanocobalamin (VITAMIN B12) 1000 MCG tablet Take 1,000 mcg by mouth daily.    [provider]  cyclobenzaprine  (FLEXERIL) 10 MG tablet TK 1 T PO  HS Patient not taking: Reported on 03/17/2024 12/24/18   [provider]  desonide (DESOWEN) 0.05 % cream APPLY A THIN LAYER TO AFFECTED AREAS AS DIRECTED 08/04/19   [provider]  DIVIGEL  1 MG/GM GEL TAKE 1 PACKAGE BY MOUTH ONCE DAILY 04/07/18   [provider]  fluconazole  (DIFLUCAN ) 150 MG tablet Take 1 tablet (150 mg total) by mouth every 3 (three) days. Patient not taking: Reported on 04/07/2024 03/25/23   Kabbe, Angela M, NP  fluticasone (FLONASE) 50 MCG/ACT nasal spray Place 1 spray into both nostrils daily.    [provider]  loratadine (CLARITIN) 10 MG tablet Take 10 mg by mouth daily.    [provider]  metFORMIN (GLUCOPHAGE) 500 MG tablet Take 500 mg by mouth daily. 10/09/23   [provider]  methocarbamol (ROBAXIN) 750 MG tablet Take by mouth. 10/22/23   [provider]  NEOMYCIN -POLYMYXIN-HYDROCORTISONE (CORTISPORIN) 1 % SOLN OTIC solution Apply 1-2 drops to toe BID after soaking 02/27/24   Hyatt, Max T, DPM  Olopatadine HCl 0.2 % SOLN Apply 1 drop to eye daily.    [provider]  terconazole (TERAZOL 7) 0.4 % vaginal cream Place 1 applicator vaginally at bedtime. Patient not taking: Reported on 04/07/2024 01/02/24   [provider]  zolpidem (AMBIEN) 10 MG tablet Take 10 mg by mouth at bedtime. 09/16/19   [provider]  Current Outpatient Medications  Medication Sig Dispense Refill   Calcium Carb-Cholecalciferol (CALCIUM 600+D3) 600-20 MG-MCG TABS Take 1 tablet by mouth daily.     Cholecalciferol (VITAMIN D3) 125 MCG (5000 UT) CAPS Take 1 capsule by mouth daily.     ibuprofen  (ADVIL ) 800 MG tablet Take 1 tablet (800 mg total) by mouth every 8 (eight) hours as needed. 30 tablet 0   atorvastatin (LIPITOR) 10 MG tablet TK 1 T PO QD (Patient not taking: Reported on 04/07/2024)     cyanocobalamin (VITAMIN B12) 1000 MCG tablet Take 1,000 mcg by mouth daily.      cyclobenzaprine (FLEXERIL) 10 MG tablet TK 1 T PO  HS (Patient not taking: Reported on 03/17/2024)     desonide (DESOWEN) 0.05 % cream APPLY A THIN LAYER TO AFFECTED AREAS AS DIRECTED     DIVIGEL  1 MG/GM GEL TAKE 1 PACKAGE BY MOUTH ONCE DAILY  4   fluconazole  (DIFLUCAN ) 150 MG tablet Take 1 tablet (150 mg total) by mouth every 3 (three) days. (Patient not taking: Reported on 04/07/2024) 2 tablet 0   fluticasone (FLONASE) 50 MCG/ACT nasal spray Place 1 spray into both nostrils daily.     loratadine (CLARITIN) 10 MG tablet Take 10 mg by mouth daily.     metFORMIN (GLUCOPHAGE) 500 MG tablet Take 500 mg by mouth daily.     methocarbamol (ROBAXIN) 750 MG tablet Take by mouth.     NEOMYCIN -POLYMYXIN-HYDROCORTISONE (CORTISPORIN) 1 % SOLN OTIC solution Apply 1-2 drops to toe BID after soaking 10 mL 1   Olopatadine HCl 0.2 % SOLN Apply 1 drop to eye daily.     terconazole (TERAZOL 7) 0.4 % vaginal cream Place 1 applicator vaginally at bedtime. (Patient not taking: Reported on 04/07/2024)     zolpidem (AMBIEN) 10 MG tablet Take 10 mg by mouth at bedtime.     Current Facility-Administered Medications  Medication Dose Route Frequency Provider Last Rate Last Admin   0.9 %  sodium chloride infusion  500 mL Intravenous Continuous Elois Hair, MD        Allergies as of 04/07/2024 - Review Complete 04/07/2024  Allergen Reaction Noted   Codeine Other (See Comments) 02/15/2011   Septra [bactrim] Other (See Comments) 02/15/2011   Sulfa antibiotics Diarrhea 05/02/2018   Sulfamethoxazole-trimethoprim Diarrhea 02/15/2011   Tetracyclines & related Other (See Comments) 02/15/2011    Family History  Problem Relation Age of Onset   Breast cancer Mother    Cancer Mother        ovarian   Kidney disease Father    Rectal cancer Neg Hx    Stomach cancer Neg Hx    Colon cancer Neg Hx     Social History   Socioeconomic History   Marital status: Unknown    Spouse name: Not on file   Number of children:  Not on file   Years of education: Not on file   Highest education level: Not on file  Occupational History   Not on file  Tobacco Use   Smoking status: Never   Smokeless tobacco: Never  Vaping Use   Vaping status: Never Used  Substance and Sexual Activity   Alcohol use: No   Drug use: No   Sexual activity: Not on file    Comment: hysterectomy  Other Topics Concern   Not on file  Social History Narrative   ** Merged History Encounter **       Social Drivers of Health   Financial Resource Strain:  Not on file  Food Insecurity: Not on file  Transportation Needs: Not on file  Physical Activity: Not on file  Stress: Not on file  Social Connections: Not on file  Intimate Partner Violence: Not on file    Review of Systems:  All other review of systems negative except as mentioned in the HPI.  Physical Exam: Vital signs BP 122/75   Pulse 66   Temp 97.9 F (36.6 C)   Ht 5\' 2"  (1.575 m)   Wt 165 lb (74.8 kg)   SpO2 100%   BMI 30.18 kg/m   General:   Alert,  Well-developed, well-nourished, pleasant and cooperative in NAD Airway:  Mallampati 2 Lungs:  Clear throughout to auscultation.   Heart:  Regular rate and rhythm; no murmurs, clicks, rubs,  or gallops. Abdomen:  Soft, nontender and nondistended. Normal bowel sounds.   Neuro/Psych:  Normal mood and affect. A and O x 3   Khadim Lundberg E. Cherryl Corona, MD [facility]

## 2024-04-07 NOTE — Patient Instructions (Signed)

## 2024-04-07 NOTE — Op Note (Signed)
 [facility] Patient Name: Lisa Bell Procedure Date: 04/07/2024 9:10 AM MRN: [id number] Endoscopist: Geralyn Knee E. Cherryl Corona , MD, [id number] Age: 63 Referring MD:  Date of Birth: Oct 09, 1961 Gender: Female Account #: [id number] Procedure:                Colonoscopy Indications:              Screening for colorectal malignant neoplasm (last                            colonoscopy was 10 years ago) Medicines:                Monitored Anesthesia Care Procedure:                Pre-Anesthesia Assessment:                           - Prior to the procedure, a History and Physical                            was performed, and patient medications and                            allergies were reviewed. The patient's tolerance of                            previous anesthesia was also reviewed. The risks                            and benefits of the procedure and the sedation                            options and risks were discussed with the patient.                            All questions were answered, and informed consent                            was obtained. Prior Anticoagulants: The patient has                            taken no anticoagulant or antiplatelet agents. ASA                            Grade Assessment: II - A patient with mild systemic                            disease. After reviewing the risks and benefits,                            the patient was deemed in satisfactory condition to                            undergo the procedure.  After obtaining informed consent, the colonoscope                            was passed under direct vision. Throughout the                            procedure, the patient's blood pressure, pulse, and                            oxygen saturations were monitored continuously. The                            Olympus CF-HQ190L ([id number]) Colonoscope was                            introduced through the  anus and advanced to the the                            terminal ileum, with identification of the                            appendiceal orifice and IC valve. Scope In: 9:22:22 AM Scope Out: 9:37:18 AM Scope Withdrawal Time: 0 hours 8 minutes 44 seconds  Total Procedure Duration: 0 hours 14 minutes 56 seconds  Findings:                 The perianal and digital rectal examinations were                            normal. Pertinent negatives include normal                            sphincter tone and no palpable rectal lesions.                           A 3 mm polyp was found in the ascending colon. The                            polyp was sessile. The polyp was removed with a                            cold snare. Resection and retrieval were complete.                            Estimated blood loss was minimal.                           The exam was otherwise normal throughout the                            examined colon.                           The terminal ileum appeared normal.  The retroflexed view of the distal rectum and anal                            verge was normal and showed no anal or rectal                            abnormalities. Complications:            No immediate complications. Estimated Blood Loss:     Estimated blood loss was minimal. Impression:               - One 3 mm polyp in the ascending colon, removed                            with a cold snare. Resected and retrieved.                           - The examined portion of the ileum was normal.                           - The distal rectum and anal verge are normal on                            retroflexion view. Recommendation:           - Patient has a contact number available for                            emergencies. The signs and symptoms of potential                            delayed complications were discussed with the                            patient. Return to normal  activities tomorrow.                            Written discharge instructions were provided to the                            patient.                           - Resume previous diet.                           - Continue present medications.                           - Await pathology results.                           - Repeat colonoscopy (date not yet determined) for                            surveillance based on pathology results. Nakiah Osgood E. Cherryl Corona,  MD 04/07/2024 9:42:01 AM This report has been signed electronically.

## 2024-04-08 ENCOUNTER — Telehealth: Payer: Self-pay | Admitting: *Deleted

## 2024-04-08 NOTE — Telephone Encounter (Signed)
  Follow up Call-     04/07/2024    8:07 AM  Call back number  Post procedure Call Back phone  # [phone]  Permission to leave phone message Yes     Patient questions:  Do you have a fever, pain , or abdominal swelling? No. Pain Score  0 *  Have you tolerated food without any problems? Yes.    Have you been able to return to your normal activities? Yes.    Do you have any questions about your discharge instructions: Diet   No. Medications  No. Follow up visit  No.  Do you have questions or concerns about your Care? No.  Actions: * If pain score is 4 or above: No action needed, pain <4.

## 2024-04-09 LAB — SURGICAL PATHOLOGY

## 2024-04-12 ENCOUNTER — Encounter: Payer: Self-pay | Admitting: Gastroenterology

## 2024-04-12 NOTE — Progress Notes (Signed)
 Lisa Bell,  The polyp which I removed during your recent procedure was proven to be completely benign but is considered a "pre-cancerous" polyp that MAY have grown into cancer if it had not been removed.  Studies shows that at least 20% of women over age 61 and 30% of men over age 40 have pre-cancerous polyps.  Based on current nationally recognized surveillance guidelines, I recommend that you have a repeat colonoscopy in 7 years.   If you develop any new rectal bleeding, abdominal pain or significant bowel habit changes, please contact me before then.

## 2024-04-30 ENCOUNTER — Other Ambulatory Visit

## 2024-06-04 ENCOUNTER — Other Ambulatory Visit

## 2024-06-29 ENCOUNTER — Ambulatory Visit

## 2024-06-29 DIAGNOSIS — M2141 Flat foot [pes planus] (acquired), right foot: Secondary | ICD-10-CM

## 2024-06-29 DIAGNOSIS — M7752 Other enthesopathy of left foot: Secondary | ICD-10-CM

## 2024-06-30 NOTE — Progress Notes (Signed)
 Orthotics   Patient was present and evaluated for Custom molded foot orthotics. Patient will benefit from CFO's to provide total contact to BIL MLA's helping to balance and distribute body weight more evenly across BIL feet helping to reduce plantar pressure and pain. Orthotic will also encourage FF / RF alignment  Patient was scanned today and will return for fitting upon receipt   Patient is checking with insurance before we order to see where deductible is   Lolita Schultze Cped, CFo, CFm

## 2024-07-02 ENCOUNTER — Ambulatory Visit: Admitting: Podiatry

## 2024-07-02 DIAGNOSIS — M79642 Pain in left hand: Secondary | ICD-10-CM | POA: Diagnosis not present

## 2024-08-04 ENCOUNTER — Ambulatory Visit

## 2024-08-15 ENCOUNTER — Telehealth: Admitting: Family Medicine

## 2024-08-15 DIAGNOSIS — N898 Other specified noninflammatory disorders of vagina: Secondary | ICD-10-CM

## 2024-08-15 DIAGNOSIS — R300 Dysuria: Secondary | ICD-10-CM | POA: Diagnosis not present

## 2024-08-15 MED ORDER — FLUCONAZOLE 150 MG PO TABS: 150.0000 mg | ORAL_TABLET | Freq: Once | ORAL | 0 refills | Status: AC

## 2024-08-15 MED ORDER — NITROFURANTOIN MONOHYD MACRO 100 MG PO CAPS: 100.0000 mg | ORAL_CAPSULE | Freq: Two times a day (BID) | ORAL | 0 refills | Status: AC

## 2024-08-15 NOTE — Patient Instructions (Signed)
 Lisa Bell, thank you for joining Roosvelt Mater, PA-C for today's virtual visit.  While this provider is not your primary care provider (PCP), if your PCP is located in our provider database this encounter information will be shared with them immediately following your visit.   A [facility] MyChart account gives you access to today's visit and all your visits, tests, and labs performed at [facility]  click here if you don't have a [facility] MyChart account or go to [email or web address]  Consent: (Patient) Lisa Bell provided verbal consent for this virtual visit at the beginning of the encounter.  Current Medications:  Current Outpatient Medications:    fluconazole  (DIFLUCAN ) 150 MG tablet, Take 1 tablet (150 mg total) by mouth once for 1 dose. Take one tab today if symptoms persist after 72 hours may take second tab., Disp: 2 tablet, Rfl: 0   nitrofurantoin , macrocrystal-monohydrate, (MACROBID ) 100 MG capsule, Take 1 capsule (100 mg total) by mouth 2 (two) times daily for 5 days., Disp: 10 capsule, Rfl: 0   atorvastatin (LIPITOR) 10 MG tablet, TK 1 T PO QD (Patient not taking: Reported on 04/07/2024), Disp: , Rfl:    Calcium Carb-Cholecalciferol (CALCIUM 600+D3) 600-20 MG-MCG TABS, Take 1 tablet by mouth daily., Disp: , Rfl:    Cholecalciferol (VITAMIN D3) 125 MCG (5000 UT) CAPS, Take 1 capsule by mouth daily., Disp: , Rfl:    cyanocobalamin (VITAMIN B12) 1000 MCG tablet, Take 1,000 mcg by mouth daily., Disp: , Rfl:    cyclobenzaprine (FLEXERIL) 10 MG tablet, TK 1 T PO  HS (Patient not taking: Reported on 03/17/2024), Disp: , Rfl:    desonide (DESOWEN) 0.05 % cream, APPLY A THIN LAYER TO AFFECTED AREAS AS DIRECTED, Disp: , Rfl:    DIVIGEL  1 MG/GM GEL, TAKE 1 PACKAGE BY MOUTH ONCE DAILY, Disp: , Rfl: 4   fluticasone (FLONASE) 50 MCG/ACT nasal spray, Place 1 spray into both nostrils daily., Disp: , Rfl:    ibuprofen  (ADVIL ) 800 MG tablet, Take 1 tablet (800 mg  total) by mouth every 8 (eight) hours as needed., Disp: 30 tablet, Rfl: 0   loratadine (CLARITIN) 10 MG tablet, Take 10 mg by mouth daily., Disp: , Rfl:    metFORMIN (GLUCOPHAGE) 500 MG tablet, Take 500 mg by mouth daily., Disp: , Rfl:    methocarbamol (ROBAXIN) 750 MG tablet, Take by mouth., Disp: , Rfl:    NEOMYCIN -POLYMYXIN-HYDROCORTISONE (CORTISPORIN) 1 % SOLN OTIC solution, Apply 1-2 drops to toe BID after soaking, Disp: 10 mL, Rfl: 1   Olopatadine HCl 0.2 % SOLN, Apply 1 drop to eye daily., Disp: , Rfl:    terconazole (TERAZOL 7) 0.4 % vaginal cream, Place 1 applicator vaginally at bedtime. (Patient not taking: Reported on 04/07/2024), Disp: , Rfl:    zolpidem (AMBIEN) 10 MG tablet, Take 10 mg by mouth at bedtime., Disp: , Rfl:    Medications ordered in this encounter:  Meds ordered this encounter  Medications   nitrofurantoin , macrocrystal-monohydrate, (MACROBID ) 100 MG capsule    Sig: Take 1 capsule (100 mg total) by mouth 2 (two) times daily for 5 days.    Dispense:  10 capsule    Refill:  0   fluconazole  (DIFLUCAN ) 150 MG tablet    Sig: Take 1 tablet (150 mg total) by mouth once for 1 dose. Take one tab today if symptoms persist after 72 hours may take second tab.    Dispense:  2 tablet    Refill:  0     *  If you need refills on other medications prior to your next appointment, please contact your pharmacy*  Follow-Up: Call back or seek an in-person evaluation if the symptoms worsen or if the condition fails to improve as anticipated.  [facility] [phone]  Other Instructions Urinary Tract Infection, Female A urinary tract infection (UTI) is an infection in your urinary tract. The urinary tract is made up of organs that make, store, and get rid of pee (urine) in your body. These organs include: The kidneys. The ureters. The bladder. The urethra. What are the causes? Most UTIs are caused by germs called bacteria. They may be in or near your genitals.  These germs grow and cause swelling in your urinary tract. What increases the risk? You're more likely to get a UTI if: You're a female. The urethra is shorter in females than in males. You have a soft tube called a catheter that drains your pee. You can't control when you pee or poop. You have trouble peeing because of: A kidney stone. A urinary blockage. A nerve condition that affects your bladder. Not getting enough to drink. You're sexually active. You use a birth control inside your vagina, like spermicide. You're pregnant. You have low levels of the hormone estrogen in your body. You're an older adult. You're also more likely to get a UTI if you have other health problems. These may include: Diabetes. A weak immune system. Your immune system is your body's defense system. Sickle cell disease. Injury of the spine. What are the signs or symptoms? Symptoms may include: Needing to pee right away. Peeing small amounts often. Pain or burning when you pee. Blood in your pee. Pee that smells bad or odd. Pain in your belly or lower back. You may also: Feel confused. This may be the first symptom in older adults. Vomit. Not feel hungry. Feel tired or easily annoyed. Have a fever or chills. How is this diagnosed? A UTI is diagnosed based on your medical history and an exam. You may also have other tests. These may include: Pee tests. Blood tests. Tests for sexually transmitted infections (STIs). If you've had more than one UTI, you may need to have imaging studies done to find out why you keep getting them. How is this treated? A UTI can be treated by: Taking antibiotics or other medicines. Drinking enough fluid to keep your pee pale yellow. In rare cases, a UTI can cause a very bad condition called sepsis. Sepsis may be treated in the hospital. Follow these instructions at home: Medicines Take your medicines only as told by your health care provider. If you were given  antibiotics, take them as told by your provider. Do not stop taking them even if you start to feel better. General instructions Make sure you: Pee often and fully. Do not hold your pee for a long time. Wipe from front to back after you pee or poop. Use each tissue only once when you wipe. Pee after you have sex. Do not douche or use sprays or powders in your genital area. Contact a health care provider if: Your symptoms don't get better after 1-2 days of taking antibiotics. Your symptoms go away and then come back. You have a fever or chills. You vomit or feel like you may vomit. Get help right away if: You have very bad pain in your back or lower belly. You faint. This information is not intended to replace advice given to you by your health care provider.  Make sure you discuss any questions you have with your health care provider. Document Revised: 10/30/2023 Document Reviewed: 02/22/2023 Elsevier Patient Education  [employer].   If you have been instructed to have an in-person evaluation today at a local Urgent Care facility, please use the link below. It will take you to a list of all of our available [facility], including address, phone number and hours of operation. Please do not delay care.  [facility]  If you or a family member do not have a primary care provider, use the link below to schedule a visit and establish care. When you choose a [facility] primary care physician or advanced practice provider, you gain a long-term partner in health. Find a Primary Care Provider  Learn more about [facility]'s in-office and virtual care options: [facility] - Get Care Now

## 2024-08-15 NOTE — Progress Notes (Signed)
 Virtual Visit Consent   Lisa Bell, you are scheduled for a virtual visit with a [facility] provider today. Just as with appointments in the office, your consent must be obtained to participate. Your consent will be active for this visit and any virtual visit you may have with one of our providers in the next 365 days. If you have a MyChart account, a copy of this consent can be sent to you electronically.  As this is a virtual visit, video technology does not allow for your provider to perform a traditional examination. This may limit your provider's ability to fully assess your condition. If your provider identifies any concerns that need to be evaluated in person or the need to arrange testing (such as labs, EKG, etc.), we will make arrangements to do so. Although advances in technology are sophisticated, we cannot ensure that it will always work on either your end or our end. If the connection with a video visit is poor, the visit may have to be switched to a telephone visit. With either a video or telephone visit, we are not always able to ensure that we have a secure connection.  By engaging in this virtual visit, you consent to the provision of healthcare and authorize for your insurance to be billed (if applicable) for the services provided during this visit. Depending on your insurance coverage, you may receive a charge related to this service.  I need to obtain your verbal consent now. Are you willing to proceed with your visit today? Lisa Bell has provided verbal consent on 08/15/2024 for a virtual visit (video or telephone). Roosvelt Mater, [place]  Date: 08/15/2024 11:36 AM   Virtual Visit via Video Note   I, Roosvelt Mater, connected with  Lisa Bell  ([id number], 12/09/1960) on 08/15/24 at 11:30 AM EDT by a video-enabled telemedicine application and verified that I am speaking with the correct person using two identifiers.  Location: Patient: Virtual Visit Location  Patient: Home Provider: Virtual Visit Location Provider: Home Office   I discussed the limitations of evaluation and management by telemedicine and the availability of in person appointments. The patient expressed understanding and agreed to proceed.    History of Present Illness: Lisa Bell is a 63 y.o. who identifies as a female who was assigned female at birth, and is being seen today for c/o a UTI.  Pt states she has foul order and cloudy urine and is going to the bathroom often and some burning and irritation when urinating. Pt states she has a milky white discharge as well.  Pt states she also has frequent yeast infections.   HPI: HPI  Problems:  Patient Active Problem List   Diagnosis Date Noted   Flatulence, eructation and gas pain 01/14/2024   Insomnia, unspecified 01/14/2024   Nontoxic goiter, unspecified 12/11/2022   Derangement of left knee 03/25/2022   Pain in right knee 12/24/2019   GERD (gastroesophageal reflux disease) 01/20/2015   Glucose intolerance (impaired glucose tolerance) 01/20/2015   Osteopenia 01/20/2015   Pelvic pain in female 01/20/2015   Thyroid  nodule 01/20/2015   Multinodular goiter 06/04/2012    Allergies:  Allergies  Allergen Reactions   Codeine Other (See Comments)    GI UPSET   Septra [Bactrim] Other (See Comments)    GI UPSET   Sulfa Antibiotics Diarrhea   Sulfamethoxazole-Trimethoprim Diarrhea    Other Reaction(s): Other (See Comments), Unknown  GI UPSET   Tetracyclines & Related Other (See Comments)  GI UPSET   Medications:  Current Outpatient Medications:    fluconazole  (DIFLUCAN ) 150 MG tablet, Take 1 tablet (150 mg total) by mouth once for 1 dose. Take one tab today if symptoms persist after 72 hours may take second tab., Disp: 2 tablet, Rfl: 0   nitrofurantoin , macrocrystal-monohydrate, (MACROBID ) 100 MG capsule, Take 1 capsule (100 mg total) by mouth 2 (two) times daily for 5 days., Disp: 10 capsule, Rfl: 0   atorvastatin  (LIPITOR) 10 MG tablet, TK 1 T PO QD (Patient not taking: Reported on 04/07/2024), Disp: , Rfl:    Calcium Carb-Cholecalciferol (CALCIUM 600+D3) 600-20 MG-MCG TABS, Take 1 tablet by mouth daily., Disp: , Rfl:    Cholecalciferol (VITAMIN D3) 125 MCG (5000 UT) CAPS, Take 1 capsule by mouth daily., Disp: , Rfl:    cyanocobalamin (VITAMIN B12) 1000 MCG tablet, Take 1,000 mcg by mouth daily., Disp: , Rfl:    cyclobenzaprine (FLEXERIL) 10 MG tablet, TK 1 T PO  HS (Patient not taking: Reported on 03/17/2024), Disp: , Rfl:    desonide (DESOWEN) 0.05 % cream, APPLY A THIN LAYER TO AFFECTED AREAS AS DIRECTED, Disp: , Rfl:    DIVIGEL  1 MG/GM GEL, TAKE 1 PACKAGE BY MOUTH ONCE DAILY, Disp: , Rfl: 4   fluticasone (FLONASE) 50 MCG/ACT nasal spray, Place 1 spray into both nostrils daily., Disp: , Rfl:    ibuprofen  (ADVIL ) 800 MG tablet, Take 1 tablet (800 mg total) by mouth every 8 (eight) hours as needed., Disp: 30 tablet, Rfl: 0   loratadine (CLARITIN) 10 MG tablet, Take 10 mg by mouth daily., Disp: , Rfl:    metFORMIN (GLUCOPHAGE) 500 MG tablet, Take 500 mg by mouth daily., Disp: , Rfl:    methocarbamol (ROBAXIN) 750 MG tablet, Take by mouth., Disp: , Rfl:    NEOMYCIN -POLYMYXIN-HYDROCORTISONE (CORTISPORIN) 1 % SOLN OTIC solution, Apply 1-2 drops to toe BID after soaking, Disp: 10 mL, Rfl: 1   Olopatadine HCl 0.2 % SOLN, Apply 1 drop to eye daily., Disp: , Rfl:    terconazole (TERAZOL 7) 0.4 % vaginal cream, Place 1 applicator vaginally at bedtime. (Patient not taking: Reported on 04/07/2024), Disp: , Rfl:    zolpidem (AMBIEN) 10 MG tablet, Take 10 mg by mouth at bedtime., Disp: , Rfl:   Observations/Objective: Patient is well-developed, well-nourished in no acute distress.  Resting comfortably at home.  Head is normocephalic, atraumatic.  No labored breathing.  Speech is clear and coherent with logical content.  Patient is alert and oriented at baseline.    Assessment and Plan: 1. Dysuria (Primary) -  nitrofurantoin , macrocrystal-monohydrate, (MACROBID ) 100 MG capsule; Take 1 capsule (100 mg total) by mouth 2 (two) times daily for 5 days.  Dispense: 10 capsule; Refill: 0  2. Vaginal discharge - fluconazole  (DIFLUCAN ) 150 MG tablet; Take 1 tablet (150 mg total) by mouth once for 1 dose. Take one tab today if symptoms persist after 72 hours may take second tab.  Dispense: 2 tablet; Refill: 0  -Start Macrobid  and Fluconazole  -Pt advised to follow up  in person with PCP or urgent care for persistent or worsening   Follow Up Instructions: I discussed the assessment and treatment plan with the patient. The patient was provided an opportunity to ask questions and all were answered. The patient agreed with the plan and demonstrated an understanding of the instructions.  A copy of instructions were sent to the patient via MyChart unless otherwise noted below.    The patient was advised to call  back or seek an in-person evaluation if the symptoms worsen or if the condition fails to improve as anticipated.    Roosvelt Mater, PA-C

## 2024-08-16 ENCOUNTER — Ambulatory Visit

## 2024-09-21 DIAGNOSIS — N898 Other specified noninflammatory disorders of vagina: Secondary | ICD-10-CM | POA: Diagnosis not present

## 2024-10-14 DIAGNOSIS — Z01419 Encounter for gynecological examination (general) (routine) without abnormal findings: Secondary | ICD-10-CM | POA: Diagnosis not present

## 2024-10-14 DIAGNOSIS — Z114 Encounter for screening for human immunodeficiency virus [HIV]: Secondary | ICD-10-CM | POA: Diagnosis not present

## 2024-10-14 DIAGNOSIS — N898 Other specified noninflammatory disorders of vagina: Secondary | ICD-10-CM | POA: Diagnosis not present

## 2024-10-14 DIAGNOSIS — Z90710 Acquired absence of both cervix and uterus: Secondary | ICD-10-CM | POA: Diagnosis not present

## 2024-10-14 DIAGNOSIS — Z1159 Encounter for screening for other viral diseases: Secondary | ICD-10-CM | POA: Diagnosis not present

## 2024-11-22 ENCOUNTER — Ambulatory Visit (HOSPITAL_COMMUNITY)

## 2024-11-23 ENCOUNTER — Ambulatory Visit
Admission: RE | Admit: 2024-11-23 | Discharge: 2024-11-23 | Disposition: A | Discharge status: Home / Self Care | Attending: Student | Admitting: Student

## 2024-11-23 VITALS — BP 124/80 | HR 92 | Temp 98.3°F | Resp 18

## 2024-11-23 DIAGNOSIS — N760 Acute vaginitis: Secondary | ICD-10-CM | POA: Insufficient documentation

## 2024-11-23 MED ORDER — FLUCONAZOLE 150 MG PO TABS: 150.0000 mg | ORAL_TABLET | Freq: Every day | ORAL | 0 refills | Status: DC

## 2024-11-23 NOTE — Discharge Instructions (Addendum)
-   We are testing for BV, yeast, gonorrhea, chlamydia, trichomonas -If you are positive for a yeast infection (vaginal candidiasis) start the diflucan . Take pill #1. If symptoms persist in 3 days, take pill #2.  -We will call with any positive results in about 3 business days and can send treatment if necessary. -We will not call with negative lab results. -Lab results will automatically go to your MyChart. -Follow-up with your gynecologist to further address CV

## 2024-11-23 NOTE — ED Provider Notes (Signed)
 " [facility]    CSN: [id number] Arrival date & time: 11/23/24  1600      History   Chief Complaint Chief Complaint  Patient presents with   Vaginal Discharge    HPI Lisa Bell is a 63 y.o. female presenting with vaginitis.  Pt reports vaginal irritation, itching, and discharge x 1 week. Describes the discharge as thick and white. Initially with odor, but no longer. States her GYN dx her with CV last month, and was told to do baking soda douches x2 weeks. I do not have access to these records. Her next appointment with them is 12/11/23. Denies urinary symptoms. Denies abd pain. No meds tried at home.     HPI  Past Medical History:  Diagnosis Date   Arthritis    Goiter    Macromastia 08/07/2017   Rash, skin     Patient Active Problem List   Diagnosis Date Noted   Flatulence, eructation and gas pain 01/14/2024   Insomnia, unspecified 01/14/2024   Nontoxic goiter, unspecified 12/11/2022   Derangement of left knee 03/25/2022   Pain in right knee 12/24/2019   GERD (gastroesophageal reflux disease) 01/20/2015   Glucose intolerance (impaired glucose tolerance) 01/20/2015   Osteopenia 01/20/2015   Pelvic pain in female 01/20/2015   Thyroid  nodule 01/20/2015   Multinodular goiter 06/04/2012    Past Surgical History:  Procedure Laterality Date   ABDOMINAL HYSTERECTOMY     BREAST REDUCTION SURGERY Bilateral 08/12/2017   Procedure: MAMMARY REDUCTION  (BREAST);  Surgeon: Marcus Lung, MD;  Location: [facility];  Service: Plastics;  Laterality: Bilateral;   BREAST SURGERY     cyst (benign)   FINGER AMPUTATION  08/08/11   right   LABIAL ADHESION LYSIS     LIPOSUCTION Bilateral 08/12/2017   Procedure: LIPOSUCTION;  Surgeon: Marcus Lung, MD;  Location: [facility];  Service: Plastics;  Laterality: Bilateral;   REDUCTION MAMMAPLASTY Bilateral 2018   TUBAL LIGATION      OB History   No obstetric history on file.       Home Medications    Prior to Admission medications  Medication Sig Start Date End Date Taking? Authorizing Provider  Calcium Carb-Cholecalciferol (CALCIUM 600+D3) 600-20 MG-MCG TABS Take 1 tablet by mouth daily.   Yes [provider]  Cholecalciferol (VITAMIN D3) 125 MCG (5000 UT) CAPS Take 1 capsule by mouth daily.   Yes [provider]  cyanocobalamin (VITAMIN B12) 1000 MCG tablet Take 1,000 mcg by mouth daily.   Yes [provider]  desonide (DESOWEN) 0.05 % cream APPLY A THIN LAYER TO AFFECTED AREAS AS DIRECTED 08/04/19  Yes [provider]  fluconazole  (DIFLUCAN ) 150 MG tablet Take 1 tablet (150 mg total) by mouth daily. -For your yeast infection, start the Diflucan  (fluconazole )- Take one pill today (day 1). If you're still having symptoms in 3 days, take the second pill. 11/23/24  Yes Meryl Hubers E, PA-C  fluticasone (FLONASE) 50 MCG/ACT nasal spray Place 1 spray into both nostrils daily.   Yes [provider]  ibuprofen  (ADVIL ) 800 MG tablet Take 1 tablet (800 mg total) by mouth every 8 (eight) hours as needed. 04/23/19  Yes Gershon Donnice SAUNDERS, DPM  loratadine (CLARITIN) 10 MG tablet Take 10 mg by mouth daily.   Yes [provider]  metFORMIN (GLUCOPHAGE) 500 MG tablet Take 500 mg by mouth daily. 10/09/23  Yes [provider]  methocarbamol (ROBAXIN) 750 MG tablet Take by mouth. 10/22/23  Yes [provider]  Vitamin D, Ergocalciferol, (DRISDOL) 1.25 MG (50000 UNIT) CAPS capsule Take 50,000 Units by mouth once a week. 09/28/24  Yes [provider]  zolpidem (AMBIEN) 10 MG tablet Take 10 mg by mouth at bedtime. 09/16/19  Yes [provider]    Family History Family History  Problem Relation Age of Onset   Breast cancer Mother    Cancer Mother        ovarian   Kidney disease Father    Rectal cancer Neg Hx    Stomach cancer Neg Hx    Colon cancer Neg Hx     Social History Social  History[1]   Allergies   Codeine, Septra [bactrim], Sulfa antibiotics, Sulfamethoxazole-trimethoprim, and Tetracyclines & related   Review of Systems Review of Systems  Constitutional:  Negative for chills and fever.  HENT:  Negative for sore throat.   Eyes:  Negative for pain and redness.  Respiratory:  Negative for shortness of breath.   Cardiovascular:  Negative for chest pain.  Gastrointestinal:  Negative for abdominal pain, diarrhea, nausea and vomiting.  Genitourinary:  Positive for vaginal discharge. Negative for decreased urine volume, difficulty urinating, dysuria, flank pain, frequency, genital sores, hematuria and urgency.  Musculoskeletal:  Negative for back pain.  Skin:  Negative for rash.     Physical Exam Triage Vital Signs ED Triage Vitals  Encounter Vitals Group     BP      Girls Systolic BP Percentile      Girls Diastolic BP Percentile      Boys Systolic BP Percentile      Boys Diastolic BP Percentile      Pulse      Resp      Temp      Temp src      SpO2      Weight      Height      Head Circumference      Peak Flow      Pain Score      Pain Loc      Pain Education      Exclude from Growth Chart    No data found.  Updated Vital Signs BP 124/80 (BP Location: Left Arm)   Pulse 92   Temp 98.3 F (36.8 C) (Oral)   Resp 18   SpO2 96%   Visual Acuity Right Eye Distance:   Left Eye Distance:   Bilateral Distance:    Right Eye Near:   Left Eye Near:    Bilateral Near:     Physical Exam Vitals reviewed.  Constitutional:      General: She is not in acute distress.    Appearance: Normal appearance. She is not ill-appearing.  HENT:     Head: Normocephalic and atraumatic.     Mouth/Throat:     Mouth: Mucous membranes are moist.     Comments: Moist mucous membranes Eyes:     Extraocular Movements: Extraocular movements intact.     Pupils: Pupils are equal, round, and reactive to light.  Cardiovascular:     Rate and Rhythm: Normal rate  and regular rhythm.     Heart sounds: Normal heart sounds.  Pulmonary:     Effort: Pulmonary effort is normal.     Breath sounds: Normal breath sounds. No wheezing, rhonchi or rales.  Abdominal:     General: Bowel sounds are normal. There is no distension.     Palpations: Abdomen is soft. There is no mass.  Tenderness: There is no abdominal tenderness. There is no right CVA tenderness, left CVA tenderness, guarding or rebound.  Skin:    General: Skin is warm.     Capillary Refill: Capillary refill takes less than 2 seconds.     Comments: Good skin turgor  Neurological:     General: No focal deficit present.     Mental Status: She is alert and oriented to person, place, and time.  Psychiatric:        Mood and Affect: Mood normal.        Behavior: Behavior normal.      UC Treatments / Results  Labs (all labs ordered are listed, but only abnormal results are displayed) Labs Reviewed  CERVICOVAGINAL ANCILLARY ONLY    EKG   Radiology No results found.  Procedures Procedures (including critical care time)  Medications Ordered in UC Medications - No data to display  Initial Impression / Assessment and Plan / UC Course  I have reviewed the triage vital signs and the nursing notes.  Pertinent labs & imaging results that were available during my care of the patient were reviewed by me and considered in my medical decision making (see chart for details).     Patient is a pleasant 63 y.o. female presenting with vaginitis. The patient is afebrile and nontachycardic.  Antipyretic has not been administered today.  Will send self-swab for G/C, trich, yeast, BV testing. Declines HIV, RPR. Safe sex precautions.   The patient has recently been treated for cytolytic vaginosis by her gynecologist.  I do not have access to these records.  I also do not have the ability to test for CV, or even vaginal pH, at this urgent care.  Following discussion with patient, we are testing for the  above.  I sent a prescription for Diflucan , which she will hold onto until labs result.  She has a follow-up with her gynecologist scheduled for 12/11/2023.  Final Clinical Impressions(s) / UC Diagnoses   Final diagnoses:  Vaginitis and vulvovaginitis     Discharge Instructions      - We are testing for BV, yeast, gonorrhea, chlamydia, trichomonas -If you are positive for a yeast infection (vaginal candidiasis) start the diflucan . Take pill #1. If symptoms persist in 3 days, take pill #2.  -We will call with any positive results in about 3 business days and can send treatment if necessary. -We will not call with negative lab results. -Lab results will automatically go to your MyChart. -Follow-up with your gynecologist to further address CV      ED Prescriptions     Medication Sig Dispense Auth. Provider   fluconazole  (DIFLUCAN ) 150 MG tablet Take 1 tablet (150 mg total) by mouth daily. -For your yeast infection, start the Diflucan  (fluconazole )- Take one pill today (day 1). If you're still having symptoms in 3 days, take the second pill. 2 tablet Tasmia Blumer E, PA-C      PDMP not reviewed this encounter.     [1]  Social History Tobacco Use   Smoking status: Never   Smokeless tobacco: Never  Vaping Use   Vaping status: Never Used  Substance Use Topics   Alcohol use: No   Drug use: No     Arlyss Leita BRAVO, PA-C 11/23/24 1649  "

## 2024-11-23 NOTE — ED Triage Notes (Signed)
 Pt reports vaginal irritation, itching, and discharge x 1 week. No meds tried. States her GYN dx her with CV last month

## 2024-11-24 LAB — CERVICOVAGINAL ANCILLARY ONLY
Bacterial Vaginitis (gardnerella): NEGATIVE
Candida Glabrata: NEGATIVE
Candida Vaginitis: POSITIVE — AB
Chlamydia: NEGATIVE
Comment: NEGATIVE
Comment: NEGATIVE
Comment: NEGATIVE
Comment: NEGATIVE
Comment: NEGATIVE
Comment: NORMAL
Neisseria Gonorrhea: NEGATIVE
Trichomonas: NEGATIVE

## 2024-11-25 ENCOUNTER — Ambulatory Visit (HOSPITAL_COMMUNITY): Payer: Self-pay

## 2024-12-15 ENCOUNTER — Other Ambulatory Visit: Payer: Self-pay | Admitting: Endocrinology

## 2024-12-15 DIAGNOSIS — E049 Nontoxic goiter, unspecified: Secondary | ICD-10-CM

## 2024-12-22 ENCOUNTER — Other Ambulatory Visit: Payer: Self-pay

## 2024-12-22 ENCOUNTER — Ambulatory Visit
Admission: RE | Admit: 2024-12-22 | Discharge: 2024-12-22 | Disposition: A | Discharge status: Home / Self Care | Source: Ambulatory Visit

## 2024-12-22 VITALS — BP 127/80 | HR 87 | Temp 97.2°F | Resp 18

## 2024-12-22 DIAGNOSIS — N760 Acute vaginitis: Secondary | ICD-10-CM | POA: Insufficient documentation

## 2024-12-22 LAB — POCT URINE DIPSTICK
Bilirubin, UA: NEGATIVE
Blood, UA: NEGATIVE
Glucose, UA: NEGATIVE mg/dL
Ketones, POC UA: NEGATIVE mg/dL
Leukocytes, UA: NEGATIVE
Nitrite, UA: NEGATIVE
POC PROTEIN,UA: NEGATIVE
Spec Grav, UA: 1.02
Urobilinogen, UA: 0.2 U/dL
pH, UA: 6

## 2024-12-22 MED ORDER — METRONIDAZOLE 500 MG PO TABS: 500.0000 mg | ORAL_TABLET | Freq: Two times a day (BID) | ORAL | 0 refills | Status: AC

## 2024-12-22 MED ORDER — FLUCONAZOLE 150 MG PO TABS: ORAL_TABLET | ORAL | 1 refills | Status: AC

## 2024-12-22 NOTE — ED Triage Notes (Signed)
 Pt here for vaginal discharge x 2 days with some dysuria as well

## 2024-12-22 NOTE — ED Provider Notes (Signed)
 " [facility]    CSN: [id number] Arrival date & time: 12/22/24  1554      History   Chief Complaint Chief Complaint  Patient presents with   Vaginal Itching    Having itching, discomfort, and discharge - Entered by patient    HPI Lisa Bell is a 64 y.o. female.   Pt presents today due to vaginal itching, thick, white, and malodorous vaginal discharge for the past 2 days. Pt states that she is experiencing urinary frequency and dysuria as well.   The history is provided by the patient.  Vaginal Itching    Past Medical History:  Diagnosis Date   Arthritis    Goiter    Macromastia 08/07/2017   Rash, skin     Patient Active Problem List   Diagnosis Date Noted   Flatulence, eructation and gas pain 01/14/2024   Insomnia, unspecified 01/14/2024   Nontoxic goiter, unspecified 12/11/2022   Derangement of left knee 03/25/2022   Pain in right knee 12/24/2019   GERD (gastroesophageal reflux disease) 01/20/2015   Glucose intolerance (impaired glucose tolerance) 01/20/2015   Osteopenia 01/20/2015   Pelvic pain in female 01/20/2015   Thyroid  nodule 01/20/2015   Multinodular goiter 06/04/2012    Past Surgical History:  Procedure Laterality Date   ABDOMINAL HYSTERECTOMY     BREAST REDUCTION SURGERY Bilateral 08/12/2017   Procedure: MAMMARY REDUCTION  (BREAST);  Surgeon: Marcus Lung, MD;  Location: [facility];  Service: Plastics;  Laterality: Bilateral;   BREAST SURGERY     cyst (benign)   FINGER AMPUTATION  08/08/11   right   LABIAL ADHESION LYSIS     LIPOSUCTION Bilateral 08/12/2017   Procedure: LIPOSUCTION;  Surgeon: Marcus Lung, MD;  Location: [facility];  Service: Plastics;  Laterality: Bilateral;   REDUCTION MAMMAPLASTY Bilateral 2018   TUBAL LIGATION      OB History   No obstetric history on file.      Home Medications    Prior to Admission medications  Medication Sig Start Date End Date Taking?  Authorizing Provider  fluconazole  (DIFLUCAN ) 150 MG tablet Take 1 tab po every 3 days 12/22/24  Yes Andra Krabbe C, PA-C  metroNIDAZOLE  (FLAGYL ) 500 MG tablet Take 1 tablet (500 mg total) by mouth 2 (two) times daily. 12/22/24  Yes Andra Krabbe BROCKS, PA-C  Calcium Carb-Cholecalciferol (CALCIUM 600+D3) 600-20 MG-MCG TABS Take 1 tablet by mouth daily.    [provider]  Cholecalciferol (VITAMIN D3) 125 MCG (5000 UT) CAPS Take 1 capsule by mouth daily.    [provider]  cyanocobalamin (VITAMIN B12) 1000 MCG tablet Take 1,000 mcg by mouth daily.    [provider]  desonide (DESOWEN) 0.05 % cream APPLY A THIN LAYER TO AFFECTED AREAS AS DIRECTED 08/04/19   [provider]  fluticasone (FLONASE) 50 MCG/ACT nasal spray Place 1 spray into both nostrils daily.    [provider]  ibuprofen  (ADVIL ) 800 MG tablet Take 1 tablet (800 mg total) by mouth every 8 (eight) hours as needed. 04/23/19   Gershon Donnice SAUNDERS, DPM  loratadine (CLARITIN) 10 MG tablet Take 10 mg by mouth daily.    [provider]  metFORMIN (GLUCOPHAGE) 500 MG tablet Take 500 mg by mouth daily. 10/09/23   [provider]  methocarbamol (ROBAXIN) 750 MG tablet Take by mouth. 10/22/23   [provider]  Vitamin D, Ergocalciferol, (DRISDOL) 1.25 MG (50000 UNIT) CAPS capsule Take 50,000 Units by mouth once  a week. 09/28/24   [provider]  zolpidem (AMBIEN) 10 MG tablet Take 10 mg by mouth at bedtime. 09/16/19   [provider]    Family History Family History  Problem Relation Age of Onset   Breast cancer Mother    Cancer Mother        ovarian   Kidney disease Father    Rectal cancer Neg Hx    Stomach cancer Neg Hx    Colon cancer Neg Hx     Social History Social History[1]   Allergies   Codeine, Septra [bactrim], Sulfa antibiotics, Sulfamethoxazole-trimethoprim, and Tetracyclines & related   Review of Systems Review of  Systems   Physical Exam Triage Vital Signs ED Triage Vitals  Encounter Vitals Group     BP 12/22/24 1614 127/80     Girls Systolic BP Percentile --      Girls Diastolic BP Percentile --      Boys Systolic BP Percentile --      Boys Diastolic BP Percentile --      Pulse Rate 12/22/24 1614 87     Resp 12/22/24 1614 18     Temp 12/22/24 1614 (!) 97.2 F (36.2 C)     Temp Source 12/22/24 1614 Oral     SpO2 12/22/24 1614 98 %     Weight --      Height --      Head Circumference --      Peak Flow --      Pain Score 12/22/24 1615 0     Pain Loc --      Pain Education --      Exclude from Growth Chart --    No data found.  Updated Vital Signs BP 127/80 (BP Location: Left Arm)   Pulse 87   Temp (!) 97.2 F (36.2 C) (Oral)   Resp 18   SpO2 98%   Visual Acuity Right Eye Distance:   Left Eye Distance:   Bilateral Distance:    Right Eye Near:   Left Eye Near:    Bilateral Near:     Physical Exam Vitals and nursing note reviewed.  Constitutional:      General: She is not in acute distress.    Appearance: Normal appearance. She is not ill-appearing, toxic-appearing or diaphoretic.  Eyes:     General: No scleral icterus. Cardiovascular:     Rate and Rhythm: Normal rate and regular rhythm.     Heart sounds: Normal heart sounds.  Pulmonary:     Effort: Pulmonary effort is normal. No respiratory distress.     Breath sounds: Normal breath sounds. No wheezing or rhonchi.  Abdominal:     General: Abdomen is flat. Bowel sounds are normal.     Palpations: Abdomen is soft.     Tenderness: There is abdominal tenderness in the suprapubic area. There is no right CVA tenderness or left CVA tenderness.  Skin:    General: Skin is warm.  Neurological:     Mental Status: She is alert and oriented to person, place, and time.  Psychiatric:        Mood and Affect: Mood normal.        Behavior: Behavior normal.      UC Treatments / Results  Labs (all labs ordered are listed,  but only abnormal results are displayed) Labs Reviewed  POCT URINE DIPSTICK    EKG   Radiology No results found.  Procedures Procedures (including critical care time)  Medications Ordered  in UC Medications - No data to display  Initial Impression / Assessment and Plan / UC Course  I have reviewed the triage vital signs and the nursing notes.  Pertinent labs & imaging results that were available during my care of the patient were reviewed by me and considered in my medical decision making (see chart for details).     Final Clinical Impressions(s) / UC Diagnoses   Final diagnoses:  Acute vaginitis     Discharge Instructions      Diflucan  and metronidazole  sent to pharmacy for treatment. Diflucan  has a refill on it.     ED Prescriptions     Medication Sig Dispense Auth. Provider   fluconazole  (DIFLUCAN ) 150 MG tablet Take 1 tab po every 3 days 2 tablet Andra Krabbe C, PA-C   metroNIDAZOLE  (FLAGYL ) 500 MG tablet Take 1 tablet (500 mg total) by mouth 2 (two) times daily. 14 tablet Andra Krabbe BROCKS, PA-C      PDMP not reviewed this encounter.    [1]  Social History Tobacco Use   Smoking status: Never   Smokeless tobacco: Never  Vaping Use   Vaping status: Never Used  Substance Use Topics   Alcohol use: No   Drug use: No     Andra Krabbe BROCKS, PA-C 12/22/24 1744  "

## 2024-12-22 NOTE — Discharge Instructions (Signed)
 Diflucan  and metronidazole  sent to pharmacy for treatment. Diflucan  has a refill on it.

## 2024-12-23 ENCOUNTER — Ambulatory Visit
Admission: RE | Admit: 2024-12-23 | Discharge: 2024-12-23 | Disposition: A | Source: Ambulatory Visit | Attending: Endocrinology | Admitting: Endocrinology

## 2024-12-23 ENCOUNTER — Ambulatory Visit (HOSPITAL_COMMUNITY): Payer: Self-pay

## 2024-12-23 DIAGNOSIS — E049 Nontoxic goiter, unspecified: Secondary | ICD-10-CM

## 2024-12-23 LAB — CERVICOVAGINAL ANCILLARY ONLY
Bacterial Vaginitis (gardnerella): NEGATIVE
Candida Glabrata: NEGATIVE
Candida Vaginitis: POSITIVE — AB
Chlamydia: NEGATIVE
Comment: NEGATIVE
Comment: NEGATIVE
Comment: NEGATIVE
Comment: NEGATIVE
Comment: NEGATIVE
Comment: NORMAL
Neisseria Gonorrhea: NEGATIVE
Trichomonas: NEGATIVE

## 2024-12-30 ENCOUNTER — Other Ambulatory Visit: Payer: Self-pay | Admitting: Endocrinology

## 2024-12-30 DIAGNOSIS — E041 Nontoxic single thyroid nodule: Secondary | ICD-10-CM

## 2025-01-14 ENCOUNTER — Other Ambulatory Visit
